# Patient Record
Sex: Male | Born: 1990 | Race: White | Hispanic: No | Marital: Married | Smoking: Never smoker
Health system: Southern US, Community
[De-identification: ages and names within clinical notes are randomized; demographics above are authoritative.]

## PROBLEM LIST (undated history)

## (undated) DIAGNOSIS — F411 Generalized anxiety disorder: Secondary | ICD-10-CM

## (undated) HISTORY — PX: NO PAST SURGERIES: SHX2092

## (undated) HISTORY — DX: Generalized anxiety disorder: F41.1

---
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
## 2009-04-13 ENCOUNTER — Ambulatory Visit (HOSPITAL_COMMUNITY): Payer: Self-pay | Admitting: Psychiatry

## 2017-06-19 ENCOUNTER — Ambulatory Visit (INDEPENDENT_AMBULATORY_CARE_PROVIDER_SITE_OTHER): Payer: 59 | Admitting: Osteopathic Medicine

## 2017-06-19 ENCOUNTER — Encounter: Payer: Self-pay | Admitting: Osteopathic Medicine

## 2017-06-19 VITALS — BP 131/62 | HR 61 | Temp 98.2°F | Ht 73.0 in | Wt 197.1 lb

## 2017-06-19 DIAGNOSIS — Z008 Encounter for other general examination: Secondary | ICD-10-CM

## 2017-06-19 DIAGNOSIS — F411 Generalized anxiety disorder: Secondary | ICD-10-CM | POA: Diagnosis not present

## 2017-06-19 DIAGNOSIS — Z0189 Encounter for other specified special examinations: Secondary | ICD-10-CM

## 2017-06-19 HISTORY — DX: Generalized anxiety disorder: F41.1

## 2017-06-19 MED ORDER — CLONAZEPAM 0.5 MG PO TABS: 0.2500 mg | ORAL_TABLET | Freq: Two times a day (BID) | ORAL | 0 refills | Status: DC | PRN

## 2017-06-19 MED ORDER — SERTRALINE HCL 25 MG PO TABS: 25.0000 mg | ORAL_TABLET | Freq: Every day | ORAL | 1 refills | Status: DC

## 2017-06-19 NOTE — Progress Notes (Signed)
HPI: Jesse Hoover is a 27 y.o. male who  has no past medical history on file.  he presents to [facility] today, 06/19/17,  for chief complaint of: Establish Care - new patient Biometrics form needing filled out but he doesn't have the form Anxiety     Past few years, has been having some anxiety issues - urgent care started him on Klonopin and Zoloft, when he asked them for refills he was told he needed a PCP. This was a few years ago. He states he took the Klonopin at 0.25 mg for about 2 weeks, was on the Zoloft for maybe 2 months. Noticed that it was helping, cannot recall any adverse side effects.  "Generally happy, everything to be grateful for" so not sure why he's having anxiety. Chest pain, feeling boxed in. First episode at work 3 year ago.   t this point, he states general constant level attention is starting to affect his quality of life and his work/relationships. Overall, he has a very supportive family, good job. He denies any issues with depression.  GAD 7 : Generalized Anxiety Score 06/19/2017  Nervous, Anxious, on Edge 2  Control/stop worrying 1  Worry too much - different things 1  Trouble relaxing 1  Restless 0  Easily annoyed or irritable 1  Afraid - awful might happen 1  Total GAD 7 Score 7    Depression screen PHQ 2/9 06/19/2017  Decreased Interest 0  Down, Depressed, Hopeless 0  PHQ - 2 Score 0  Altered sleeping 0  Tired, decreased energy 0  Change in appetite 1  Feeling bad or failure about yourself  0  Trouble concentrating 2  Moving slowly or fidgety/restless 0  Suicidal thoughts 0  PHQ-9 Score 3  Difficult doing work/chores Somewhat difficult   Mood disorder questionnaire negative Reports history of attention deficit for which she was on stimulants as a child, "grew out of this"and has not been on medications are felt he needed any for many years      Past medical, surgical, social and family history  reviewed:  There are no active problems to display for this patient.  History reviewed. No pertinent surgical history.  Social History   Tobacco Use  . Smoking status: Never Smoker  . Smokeless tobacco: Never Used  Substance Use Topics  . Alcohol use: Yes    Comment: 4-5 drinks once a mth    Family History  Problem Relation Age of Onset  . Alcoholism Maternal Grandmother   . Leukemia Maternal Grandmother   . Esophageal cancer Maternal Grandfather   . Lung cancer Maternal Grandfather      Current medication list and allergy/intolerance information reviewed:    No current outpatient medications on file.   No current facility-administered medications for this visit.     Allergies  Allergen Reactions  . Bee Pollen Swelling      Review of Systems:  Constitutional:  No  fever, no chills, No recent illness, No unintentional weight changes. No significant fatigue.   HEENT: No  headache, no vision change  Cardiac: No  chest pain, No  Pressure unless having an anxiety attack, No palpitations  Respiratory:  No  shortness of breath. No  Cough  Gastrointestinal: No  abdominal pain, No  nausea, No  vomiting,  No  blood in stool, No  diarrhea, No  constipation   Musculoskeletal: No new myalgia/arthralgia  Skin: No  Rash, No other wounds/concerning lesions  Hem/Onc: No  easy bruising/bleeding  Endocrine: No cold intolerance,  No heat intolerance.  Neurologic: No  weakness, No  dizziness,  Psychiatric: No  concerns with depression, +concerns with anxiety, No sleep problems, No mood problems  Exam:  BP 131/62   Pulse 61   Temp 98.2 F (36.8 C) (Oral)   Ht 6\' 1"  (1.854 m)   Wt 197 lb 1.9 oz (89.4 kg)   BMI 26.01 kg/m   Constitutional: VS see above. General Appearance: alert, well-developed, well-nourished, NAD  Eyes: Normal lids and conjunctive, non-icteric sclera  Ears, Nose, Mouth, Throat: MMM, Normal external inspection ears/nares/mouth/lips/gums.  Neck:  No masses, trachea midline. No thyroid enlargement. No tenderness/mass appreciated. No lymphadenopathy  Respiratory: Normal respiratory effort. no wheeze, no rhonchi, no rales  Cardiovascular: S1/S2 normal, no murmur, no rub/gallop auscultated. RRR.   Musculoskeletal: Gait normal.   Neurological: Normal balance/coordination. No tremor.   Skin: warm, dry, intact. No rash/ulcer  Psychiatric: Normal judgment/insight. Normal mood and affect. Oriented x3.      ASSESSMENT/PLAN:   Generalized anxiety disorder - Plan: sertraline (ZOLOFT) 25 MG tablet, clonazePAM (KLONOPIN) 0.5 MG tablet, CBC, COMPLETE METABOLIC PANEL WITH GFR, Lipid panel, TSH  Encounter for biometric screening - Plan: COMPLETE METABOLIC PANEL WITH GFR, Lipid panel    Meds ordered this encounter  Medications  . sertraline (ZOLOFT) 25 MG tablet    Sig: Take 1 tablet (25 mg total) by mouth daily.    Dispense:  30 tablet    Refill:  1  . clonazePAM (KLONOPIN) 0.5 MG tablet    Sig: Take 0.5-1 tablets (0.25-0.5 mg total) by mouth 2 (two) times daily as needed for anxiety.    Dispense:  15 tablet    Refill:  0     Patient Instructions  We are starting a medication today called sertraline aka Zoloft to help treat your anxiety. This is a daily medication to help control your symptoms and prevent panic attacks.   I also highly encourage my patients who are suffering from anxiety to seek care with a counselor or therapist. A therapist can coach you in techniques to recognize and deal with troubling thought patterns and behaviors or symptoms. The ability to cope with external stressors is crucial to overall mental health.   Expect a call or message form this office in the next 2 weeks to check in: If you're doing well on the medicine but not feeling any effect, we can increase the dose. If you're starting to feel some effect/improvement, we can hold off on a dose increase and reevaluate at your office visit.   Try the  as-needed medicine, clonazepam aka Klonopin, up to two times per day, try half dose first as this drug can cause sleepiness. Please reserve this medicine for severe symptoms. I do not prescribe this for daily use or to help with sleep, as it can cause addiction when used like this improperly.   Let's plan to follow up in the office in 4 weeks. At that time, we can talk about how well the medicine is working for you, and we can consider increasing the dose, adding another medicine, etc.   If we are having trouble finding a good medication regimen for you, we can consult with a psychiatrist to assist with medication management.   If you experience problematic side effects, please let me know ASAP - we can switch the medicine any time, and we don't need an appointment for this.   Any questions or concerns, call me!  Visit summary with medication list and pertinent instructions was printed for patient to review. All questions at time of visit were answered - patient instructed to contact office with any additional concerns. ER/RTC precautions were reviewed with the patient.   Follow-up plan: Return in about 4 weeks (around 07/17/2017) for recheck anxiety, sooner if needed.    Please note: voice recognition software was used to produce this document, and typos may escape review. Please contact Dr. Lyn Hollingshead for any needed clarifications.

## 2017-06-19 NOTE — Patient Instructions (Addendum)
We are starting a medication today called sertraline aka Zoloft to help treat your anxiety. This is a daily medication to help control your symptoms and prevent panic attacks.   I also highly encourage my patients who are suffering from anxiety to seek care with a counselor or therapist. A therapist can coach you in techniques to recognize and deal with troubling thought patterns and behaviors or symptoms. The ability to cope with external stressors is crucial to overall mental health.   Expect a call or message form this office in the next 2 weeks to check in: If you're doing well on the medicine but not feeling any effect, we can increase the dose. If you're starting to feel some effect/improvement, we can hold off on a dose increase and reevaluate at your office visit.   Try the as-needed medicine, clonazepam aka Klonopin, up to two times per day, try half dose first as this drug can cause sleepiness. Please reserve this medicine for severe symptoms. I do not prescribe this for daily use or to help with sleep, as it can cause addiction when used like this improperly.   Let's plan to follow up in the office in 4 weeks. At that time, we can talk about how well the medicine is working for you, and we can consider increasing the dose, adding another medicine, etc.   If we are having trouble finding a good medication regimen for you, we can consult with a psychiatrist to assist with medication management.   If you experience problematic side effects, please let me know ASAP - we can switch the medicine any time, and we don't need an appointment for this.   Any questions or concerns, call me!

## 2017-06-20 LAB — COMPLETE METABOLIC PANEL WITH GFR
AG Ratio: 2.2 (calc) (ref 1.0–2.5)
ALBUMIN MSPROF: 4.7 g/dL (ref 3.6–5.1)
ALKALINE PHOSPHATASE (APISO): 54 U/L (ref 40–115)
ALT: 38 U/L (ref 9–46)
AST: 27 U/L (ref 10–40)
BILIRUBIN TOTAL: 0.7 mg/dL (ref 0.2–1.2)
BUN: 12 mg/dL (ref 7–25)
CHLORIDE: 105 mmol/L (ref 98–110)
CO2: 26 mmol/L (ref 20–32)
CREATININE: 1.04 mg/dL (ref 0.60–1.35)
Calcium: 9.4 mg/dL (ref 8.6–10.3)
GFR, Est African American: 114 mL/min/{1.73_m2} (ref >=60)
GFR, Est Non African American: 98 mL/min/{1.73_m2} (ref >=60)
GLUCOSE: 93 mg/dL (ref 65–99)
Globulin: 2.1 g/dL (calc) (ref 1.9–3.7)
Potassium: 4.3 mmol/L (ref 3.5–5.3)
SODIUM: 139 mmol/L (ref 135–146)
Total Protein: 6.8 g/dL (ref 6.1–8.1)

## 2017-06-20 LAB — LIPID PANEL
CHOL/HDL RATIO: 2.7 (calc) (ref <5.0)
Cholesterol: 116 mg/dL (ref <200)
HDL: 43 mg/dL (ref >40)
LDL CHOLESTEROL (CALC): 56 mg/dL
Non-HDL Cholesterol (Calc): 73 mg/dL (calc) (ref <130)
Triglycerides: 88 mg/dL (ref <150)

## 2017-06-20 LAB — CBC
HEMATOCRIT: 43.6 % (ref 38.5–50.0)
HEMOGLOBIN: 15.5 g/dL (ref 13.2–17.1)
MCH: 30.9 pg (ref 27.0–33.0)
MCHC: 35.6 g/dL (ref 32.0–36.0)
MCV: 86.9 fL (ref 80.0–100.0)
MPV: 10.4 fL (ref 7.5–12.5)
Platelets: 203 10*3/uL (ref 140–400)
RBC: 5.02 10*6/uL (ref 4.20–5.80)
RDW: 12 % (ref 11.0–15.0)
WBC: 4 10*3/uL (ref 3.8–10.8)

## 2017-06-20 LAB — TSH: TSH: 1.59 mIU/L (ref 0.40–4.50)

## 2017-07-04 ENCOUNTER — Telehealth: Payer: Self-pay | Admitting: Osteopathic Medicine

## 2017-07-04 DIAGNOSIS — F411 Generalized anxiety disorder: Secondary | ICD-10-CM

## 2017-07-04 MED ORDER — CLONAZEPAM 0.5 MG PO TABS
0.2500 mg | ORAL_TABLET | Freq: Two times a day (BID) | ORAL | 0 refills | Status: DC | PRN
Start: 2017-07-04 — End: 2017-07-17

## 2017-07-04 NOTE — Telephone Encounter (Signed)
OK. He really shouldn't be taking this on a daily basis as this can cause dependence. Since he's been on the Zoloft for a bit now, we should reserve the Klonopin for severe anxiety or panic issues, 1-2 times per week max. Will refill this time but will discuss in detail at follow-up.

## 2017-07-04 NOTE — Telephone Encounter (Signed)
-----   Message from Sunnie NielsenNatalie Kandas Oliveto, DO sent at 06/19/2017 10:00 AM EDT ----- Regarding: anxiety  Started zoloft and klonopin

## 2017-07-04 NOTE — Telephone Encounter (Signed)
Lease call patient: Just checking seeing how he is doing after starting Zoloft and Klonopin at last visit. Any problems or questions, let me know! Otherwise can follow up as discussed at visit

## 2017-07-04 NOTE — Telephone Encounter (Signed)
Pt advised, no further questions.

## 2017-07-04 NOTE — Telephone Encounter (Signed)
Spoke with Pt, he feels the new Rx's are helping. He works 10 hr days at work, so he was taking the clonazePAM (KLONOPIN) 0.5 MG tablet one whole tablet daily. He reports trying 0.5 tab, but it wore off before the workday was over. He reports only needing one whole tab daily. He thinks the zoloft is helping also.   Because he has been taking one whole tab of the klonopin daily, he is out. Will route.

## 2017-07-17 ENCOUNTER — Encounter: Payer: Self-pay | Admitting: Osteopathic Medicine

## 2017-07-17 ENCOUNTER — Ambulatory Visit (INDEPENDENT_AMBULATORY_CARE_PROVIDER_SITE_OTHER): Payer: 59 | Admitting: Osteopathic Medicine

## 2017-07-17 VITALS — BP 121/52 | HR 56 | Temp 98.7°F | Wt 194.0 lb

## 2017-07-17 DIAGNOSIS — Z0000 Encounter for general adult medical examination without abnormal findings: Secondary | ICD-10-CM

## 2017-07-17 DIAGNOSIS — Z0001 Encounter for general adult medical examination with abnormal findings: Secondary | ICD-10-CM | POA: Diagnosis not present

## 2017-07-17 DIAGNOSIS — F411 Generalized anxiety disorder: Secondary | ICD-10-CM

## 2017-07-17 DIAGNOSIS — Z23 Encounter for immunization: Secondary | ICD-10-CM | POA: Diagnosis not present

## 2017-07-17 MED ORDER — CLONAZEPAM 0.5 MG PO TABS: ORAL_TABLET | ORAL | 1 refills | Status: DC

## 2017-07-17 MED ORDER — SERTRALINE HCL 25 MG PO TABS: 25.0000 mg | ORAL_TABLET | Freq: Every day | ORAL | 1 refills | Status: DC

## 2017-07-17 NOTE — Patient Instructions (Addendum)
Plan:  Labs annually - everything looks good now!  Anxiety: For continuation of controlled substances, I ask patients to come see me regularly, about every 6 months for continuation of refills. Please see me sooner if problems with medications, symptoms, etc!    Please note: Preventive care issues were addressed today as part of your annual wellness physical, and this care should be covered under your insurance. However there were other medical issues which were also addressed today with discussion of anxiety/medications, and insurance may bill you separately for a "problem-based visit" for this care. Any questions or concerns about charges which may appear on your billing statement should be directed to your insurance company or to [facility] billing department, and they will contact our office if there are further concerns.

## 2017-07-17 NOTE — Progress Notes (Signed)
HPI: Jesse Hoover is a 27 y.o. male who  has no past medical history on file.  he presents to [facility] today, 07/17/17,  for chief complaint of: Annual Physical/ Preventive Care   Patient here for annual physical / wellness exam.  See preventive care reviewed as below.  Recent labs reviewed in detail with the patient.   Additional concerns today include:  Anxiety medications started last visit, clonazepam seems to really help when he needs it, hasn't noticed a tender difference on the Zoloft. He reports tiredness. 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    Past medical, surgical, social and family history reviewed:  Patient Active Problem List   Diagnosis Date Noted  . Generalized anxiety disorder 06/19/2017   No past surgical history on file.  Social History   Tobacco Use  . Smoking status: Never Smoker  . Smokeless tobacco: Never Used  Substance Use Topics  . Alcohol use: Yes    Comment: 4-5 drinks once a mth    Family History  Problem Relation Age of Onset  . Alcoholism Maternal Grandmother   . Leukemia Maternal Grandmother   . Esophageal cancer Maternal Grandfather   . Lung cancer Maternal Grandfather      Current medication list and allergy/intolerance information reviewed:    Current Outpatient Medications  Medication Sig Dispense Refill  . clonazePAM (KLONOPIN) 0.5 MG tablet Take 0.5-1 tablets (0.25-0.5 mg total) by mouth 2 (two) times daily as needed for anxiety. Sparing use to prevent tolerance/dependence 15 tablet 0  . sertraline (ZOLOFT) 25 MG tablet Take 1 tablet (25 mg total) by mouth daily. 30 tablet 1   No current facility-administered medications for this visit.     Allergies  Allergen Reactions  . Bee Pollen Swelling      Review of Systems:  Constitutional:  No  fever, no chills, No recent illness, No unintentional weight changes. No significant fatigue.    HEENT: No  headache, no vision change  Cardiac: No  chest pain  Respiratory:  No  shortness of breath. No  Cough  Gastrointestinal: No  abdominal pain, No  nausea  Musculoskeletal: No new myalgia/arthralgia  Skin: No  Rash  Neurologic: No  weakness, No  dizziness  Psychiatric: No  concerns with depression, No  concerns with anxiety, No sleep problems, No mood problems  Exam:  BP (!) 121/52 (BP Location: Left Arm, Patient Position: Sitting, Cuff Size: Large)   Pulse (!) 56   Temp 98.7 F (37.1 C) (Oral)   Wt 194 lb 0.6 oz (88 kg)   BMI 25.60 kg/m   Constitutional: VS see above. General Appearance: alert, well-developed, well-nourished, NAD  Eyes: Normal lids and conjunctive, non-icteric sclera  Ears, Nose, Mouth, Throat: MMM, Normal external inspection ears/nares/mouth/lips/gums. TM normal bilaterally. Pharynx/tonsils no erythema, no exudate. Nasal mucosa normal.   Neck: No masses, trachea midline. No thyroid enlargement. No tenderness/mass appreciated. No lymphadenopathy  Respiratory: Normal respiratory effort. no wheeze, no rhonchi, no rales  Cardiovascular: S1/S2 normal, no murmur, no rub/gallop auscultated. RRR. No lower extremity edema.   Gastrointestinal: Nontender, no masses. No hepatomegaly, no splenomegaly. No hernia appreciated. Bowel sounds normal. Rectal exam deferred.   Musculoskeletal: Gait normal. No clubbing/cyanosis of digits.   Neurological: Normal balance/coordination. No tremor. No cranial nerve deficit on limited exam. Motor and sensation intact and symmetric. Cerebellar reflexes intact.   Skin: warm, dry, intact. No rash/ulcer. No concerning nevi or  subq nodules on limited exam.    Psychiatric: Normal judgment/insight. Normal mood and affect. Oriented x3.      ASSESSMENT/PLAN:   Annual physical exam  Need for Tdap vaccination - Plan: Tdap vaccine greater than or equal to 7yo IM  Generalized anxiety disorder - advised he can consider  alternative to sertraline, he would like to stay as is for now. Refills given, see patient instructions. - Plan: sertraline (ZOLOFT) 25 MG tablet, clonazePAM (KLONOPIN) 0.5 MG tablet     MALE PREVENTIVE CARE  updated 07/17/17  ANNUAL SCREENING/COUNSELING  Any changes to health in the past year? no  Diet/Exercise - HEALTHY HABITS DISCUSSED TO DECREASE CV RISK Social History   Tobacco Use  Smoking Status Never Smoker  Smokeless Tobacco Never Used   Social History   Substance and Sexual Activity  Alcohol Use Yes   Comment: 4-5 drinks once a mth   Depression screen [facility] 2/9 06/19/2017  Decreased Interest 0  Down, Depressed, Hopeless 0  PHQ - 2 Score 0  Altered sleeping 0  Tired, decreased energy 0  Change in appetite 1  Feeling bad or failure about yourself  0  Trouble concentrating 2  Moving slowly or fidgety/restless 0  Suicidal thoughts 0  PHQ-9 Score 3  Difficult doing work/chores Somewhat difficult    SEXUAL/REPRODUCTIVE HEALTH  Sexually active in the past year? - Yes with male.  STI testing needed/desired today? - no  Any concerns with testosterone/libido? - no  INFECTIOUS DISEASE SCREENING  HIV - needs, declined   GC/CT - does not need  HepC - does not need  TB - does not need  CANCER SCREENING  Lung - does not need  Colon - does not need  Prostate - does not need  OTHER DISEASE SCREENING  Lipid - does not need  DM2 - does not need  AAA - 65-75yo ever smoked: does not need  Osteoporosis - men 28yo+ - does not need  ADULT VACCINATION  Influenza - annual vaccine recommended  Td - booster every 10 years   Zoster - Shingrix recommended 80+ years old  PCV13 - was not indicated  PPSV23 - was not indicated Immunization History  Administered Date(s) Administered  . Influenza-Unspecified 02/08/2017  . Tdap 07/17/2017     Patient Instructions  Plan:  Labs annually - everything looks good now!  Anxiety: For continuation of  controlled substances, I ask patients to come see me regularly, about every 6 months for continuation of refills. Please see me sooner if problems with medications, symptoms, etc!    Please note: Preventive care issues were addressed today as part of your annual wellness physical, and this care should be covered under your insurance. However there were other medical issues which were also addressed today with discussion of anxiety/medications, and insurance may bill you separately for a "problem-based visit" for this care. Any questions or concerns about charges which may appear on your billing statement should be directed to your insurance company or to [facility] billing department, and they will contact our office if there are further concerns.       Visit summary with medication list and pertinent instructions was printed for patient to review. All questions at time of visit were answered - patient instructed to contact office with any additional concerns. ER/RTC precautions were reviewed with the patient.   Follow-up plan: Return in about 6 months (around 01/16/2018) for recheck anxiety, sooner if needed. Annual check-up and labs in one year .  Note: Total time spent on problem-baed portion of visit for anxiety follow up was 15 minutes, greater than 50% of the time was spent face-to-face counseling and coordinating care   Please note: voice recognition software was used to produce this document, and typos may escape review. Please contact Dr. Lyn HollingsheadAlexander for any needed clarifications.

## 2017-07-30 ENCOUNTER — Telehealth: Payer: Self-pay

## 2017-07-30 NOTE — Telephone Encounter (Signed)
He and I discussed sparing use of this medicine at his last visit to avoid dependence/addiction.   The pharmacy should have printed it on the Rx bottle per my instructions: "Take 0.5-1 tablets po bid prn severe anxiety. Limit use to prevent dependence. #30 x ninety days" was my instruction on last Rx which was sent 07/17/17. He should not be taking the medicine on a daily basis - if he is having panic symptoms that frequently we need to address his maintenance medicines or discuss a referral to psychiatry. He needs appointment with me to discuss increasing dose Zoloft or changing this medicine, or I can send referral to behavioral health.

## 2017-07-30 NOTE — Telephone Encounter (Signed)
Pt called requesting a refill for clonazepam. As per pt, he tried to place a refill request for the med, but was informed by pharmacist that he will not be able to get med until 07/13/17. Pt requesting a call back from provider to discuss early med refill. Thanks.

## 2017-07-31 NOTE — Telephone Encounter (Signed)
Left a detailed vm msg for pt regarding med refill & provider's note. Call back information provided.

## 2017-08-11 ENCOUNTER — Other Ambulatory Visit: Payer: Self-pay | Admitting: Osteopathic Medicine

## 2017-08-11 DIAGNOSIS — F411 Generalized anxiety disorder: Secondary | ICD-10-CM

## 2017-09-06 ENCOUNTER — Other Ambulatory Visit: Payer: Self-pay | Admitting: Osteopathic Medicine

## 2017-09-06 ENCOUNTER — Telehealth: Payer: Self-pay

## 2017-09-06 DIAGNOSIS — F411 Generalized anxiety disorder: Secondary | ICD-10-CM

## 2017-09-06 NOTE — Telephone Encounter (Signed)
Pt left msg concerned because he has been out of his Clonazepam for about 2-3 days and feels that he needs it.   Script for pt Clonazepam 0.5mg , 0.5-1 tab po BID PRN anxiety for #30 was sent in for a 90 day supply.  Pt states this is not enough. He states he used to receive #15 per month instead of #10. He is working more and more anxious.  Pt wanting to know if this can be increased

## 2017-09-07 NOTE — Telephone Encounter (Signed)
Jesse Hoover, see other note on this. I'm not going to authorize refill now, we need to chat w/ patient and make sure he understand the instructions for sparing use of this medicine.Marland KitchenMarland KitchenMarland Kitchen

## 2017-09-07 NOTE — Telephone Encounter (Signed)
Patient has been advised that clonazepam is not for daily use or for long-term control of anxiety, rather for severe symptoms as needed, limited to a maximum of few times per week to avoid dependence - 30 for 90 days should be sufficient. I am concerned he is already dependent on this medicine. Ideally, the Zoloft/sertraline should be working now such that he doesn't feel a need for clonazepam as frequently but sounds like he's been taking this daily if he is out of medicine.   I can refill but it now for 30 tablets again for 90 days but there should be no more confusion on the need to avoid daily use of these drugs and there will be no more early refills.  If anxiety isn't well controlled on zoloft, or if he has stopped taking this, he needs to make an appointment to discuss alternative medicines to help his symptoms more effectively without need for controlled substances.  If he is not amenable to this plan I am happy to refer him to a psychiatrist for second opinion.

## 2017-09-10 NOTE — Telephone Encounter (Signed)
Left detailed msg for pt on cell phone concerning Dr Amanda PeaAlexanders message. I have asked pt to call office back and let us know that he understand 30 tabs must last 90 days (no more early refills) and for him to let us know if he is taking Zoloft/is it working/does he want a second opinion from psychiatrist.

## 2017-09-10 NOTE — Telephone Encounter (Signed)
I have called and left pt a detailed msg about this this AM.

## 2017-09-11 NOTE — Telephone Encounter (Signed)
I called pt to make sure that he understood and he told me that he needed me to call back im assuming he was at work. Will retry at a later time. W.Maryanne Huneycutt, CCMA

## 2018-01-16 ENCOUNTER — Ambulatory Visit: Payer: 59 | Admitting: Osteopathic Medicine

## 2018-01-24 ENCOUNTER — Encounter: Payer: Self-pay | Admitting: Osteopathic Medicine

## 2018-01-24 ENCOUNTER — Ambulatory Visit (INDEPENDENT_AMBULATORY_CARE_PROVIDER_SITE_OTHER): Payer: 59 | Admitting: Osteopathic Medicine

## 2018-01-24 VITALS — BP 137/83 | HR 60 | Temp 97.8°F | Wt 206.9 lb

## 2018-01-24 DIAGNOSIS — F411 Generalized anxiety disorder: Secondary | ICD-10-CM

## 2018-01-24 DIAGNOSIS — Z23 Encounter for immunization: Secondary | ICD-10-CM | POA: Diagnosis not present

## 2018-01-24 MED ORDER — CLONAZEPAM 0.5 MG PO TABS: ORAL_TABLET | ORAL | 1 refills | Status: DC

## 2018-01-24 MED ORDER — SERTRALINE HCL 25 MG PO TABS: 25.0000 mg | ORAL_TABLET | Freq: Every day | ORAL | 3 refills | Status: DC

## 2018-01-24 NOTE — Progress Notes (Signed)
HPI: Jesse Hoover is a 27 y.o. male who  has no past medical history on file.  he presents to [facility] today, 01/24/18,  for chief complaint of:  Follow-up anxiety, maintenance of prescriptions  States the Zoloft has been working pretty well for him overall, he is taking half a tablet daily most of the time.  He is requiring the Klonopin weekly on work days, is struggling when he runs out of this medication.  Work is regularly stressful for him.  He does not take the medication on weekends and he does not find that he misses it.      Past medical history, surgical history, and family history reviewed.  Current medication list and allergy/intolerance information reviewed.   (See remainder of HPI, ROS, Phys Exam below)    ASSESSMENT/PLAN:   Generalized anxiety disorder - Plan: sertraline (ZOLOFT) 25 MG tablet, clonazePAM (KLONOPIN) 0.5 MG tablet, DISCONTINUED: sertraline (ZOLOFT) 25 MG tablet, DISCONTINUED: clonazePAM (KLONOPIN) 0.5 MG tablet  Need for influenza vaccination - Plan: Flu Vaccine QUAD 6+ mos PF IM (Fluarix Quad PF)   Meds ordered this encounter  Medications  . DISCONTD: sertraline (ZOLOFT) 25 MG tablet    Sig: Take 1 tablet (25 mg total) by mouth daily.    Dispense:  90 tablet    Refill:  3  . DISCONTD: clonazePAM (KLONOPIN) 0.5 MG tablet    Sig: Take 0.5-1 tablets po bid prn severe anxiety on work days. Limit use to prevent dependence. #60 for ninety days    Dispense:  60 tablet    Refill:  1  . sertraline (ZOLOFT) 25 MG tablet    Sig: Take 1 tablet (25 mg total) by mouth daily.    Dispense:  90 tablet    Refill:  3  . clonazePAM (KLONOPIN) 0.5 MG tablet    Sig: Take 0.5-1 tablets po bid prn severe anxiety on work days. Limit use to prevent dependence. #60 for ninety days    Dispense:  60 tablet    Refill:  1     Follow-up plan: Return in about 6 months (around 07/26/2018) for annual physical - sooner if  needed.                            ############################################ ############################################ ############################################ ############################################    Outpatient Encounter Medications as of 01/24/2018  Medication Sig  . clonazePAM (KLONOPIN) 0.5 MG tablet Take 0.5-1 tablets po bid prn severe anxiety. Limit use to prevent dependence. #30 x ninety days  . sertraline (ZOLOFT) 25 MG tablet Take 1 tablet (25 mg total) by mouth daily.  . sertraline (ZOLOFT) 25 MG tablet TAKE 1 TABLET BY MOUTH EVERY DAY   No facility-administered encounter medications on file as of 01/24/2018.    Allergies  Allergen Reactions  . Bee Pollen Swelling      Review of Systems:  Constitutional: No recent illness  Cardiac: No  chest pain, No  pressure, No palpitations  Respiratory:  No  shortness of breath. No  Cough  Gastrointestinal: No  abdominal pain, no change on bowel habits  Musculoskeletal: No new myalgia/arthralgia  Neurologic: No  weakness, No  Dizziness  Psychiatric: No  concerns with depression, +concerns with anxiety  Exam:  BP 137/83 (BP Location: Left Arm, Patient Position: Sitting, Cuff Size: Normal)   Pulse 60   Temp 97.8 F (36.6 C) (Oral)   Wt 206 lb 14.4 oz (93.8 kg)  BMI 27.30 kg/m   Constitutional: VS see above. General Appearance: alert, well-developed, well-nourished, NAD  Eyes: Normal lids and conjunctive, non-icteric sclera  Ears, Nose, Mouth, Throat: MMM, Normal external inspection ears/nares/mouth/lips/gums.  Neck: No masses, trachea midline.   Respiratory: Normal respiratory effort.   Musculoskeletal: Gait normal. Symmetric and independent movement of all extremities  Neurological: Normal balance/coordination. No tremor.  Skin: warm, dry, intact.   Psychiatric: Normal judgment/insight. Normal mood and affect. Oriented x3.   Visit summary with medication list  and pertinent instructions was printed for patient to review, advised to alert [place] if any changes needed. All questions at time of visit were answered - patient instructed to contact office with any additional concerns. ER/RTC precautions were reviewed with the patient and understanding verbalized.   Follow-up plan: Return in about 6 months (around 07/26/2018) for annual physical - sooner if needed.    Please note: voice recognition software was used to produce this document, and typos may escape review. Please contact Dr. Lyn Hollingshead for any needed clarifications.

## 2018-05-13 ENCOUNTER — Encounter: Payer: Self-pay | Admitting: Family Medicine

## 2018-05-13 ENCOUNTER — Ambulatory Visit: Payer: 59 | Admitting: Osteopathic Medicine

## 2018-05-13 ENCOUNTER — Ambulatory Visit (INDEPENDENT_AMBULATORY_CARE_PROVIDER_SITE_OTHER): Payer: 59 | Admitting: Family Medicine

## 2018-05-13 ENCOUNTER — Ambulatory Visit (INDEPENDENT_AMBULATORY_CARE_PROVIDER_SITE_OTHER): Payer: 59

## 2018-05-13 VITALS — BP 119/62 | HR 75

## 2018-05-13 DIAGNOSIS — M25561 Pain in right knee: Secondary | ICD-10-CM

## 2018-05-13 NOTE — Patient Instructions (Addendum)
Thank you for coming in today. Recheck in about 1 week.  Ok to start walking if able.  Use the hinged knee brace.  Ibuprofen or tylenol for pain.     Meniscus Tear  A meniscus tear is a knee injury that happens when a piece of the meniscus is torn. The meniscus is a thick, rubbery, wedge-shaped cartilage in the knee. Two menisci are located in each knee. They sit between the upper bone (femur) and lower bone (tibia) that make up the knee joint. Each meniscus acts as a shock absorber for the knee. A torn meniscus is one of the most common types of knee injuries. This injury can range from mild to severe. Surgery may be needed to repair a severe tear. What are the causes? This condition may be caused by any kneeling, squatting, twisting, or pivoting movement. Sports-related injuries are the most common cause. These often occur from:  Running and stopping suddenly. ? Changing direction. ? Being tackled or knocked off your feet.  Lifting or carrying heavy weights. As people get older, their menisci get thinner and weaker. In these people, tears can happen more easily, such as from climbing stairs. What increases the risk? You are more likely to develop this condition if you:  Play contact sports.  Have a job that requires kneeling or squatting.  Are male.  Are over 29 years old. What are the signs or symptoms? Symptoms of this condition include:  Knee pain, especially at the side of the knee joint. You may feel pain when the injury occurs, or you may only hear a pop and feel pain later.  A feeling that your knee is clicking, catching, locking, or giving way.  Not being able to fully bend or extend your knee.  Bruising or swelling in your knee. How is this diagnosed? This condition may be diagnosed based on your symptoms and a physical exam. You may also have tests, such as:  X-rays.  MRI.  A procedure to look inside your knee with a narrow surgical telescope  (arthroscopy). You may be referred to a knee specialist (orthopedic surgeon). How is this treated? Treatment for this injury depends on the severity of the tear. Treatment for a mild tear may include:  Rest.  Medicine to reduce pain and swelling. This is usually a nonsteroidal anti-inflammatory drug (NSAID), like ibuprofen.  A knee brace, sleeve, or wrap.  Using crutches or a walker to keep weight off your knee and to help you walk.  Exercises to strengthen your knee (physical therapy). You may need surgery if you have a severe tear or if other treatments are not working. Follow these instructions at home: If you have a brace, sleeve, or wrap:  Wear it as told by your health care provider. Remove it only as told by your health care provider.  Loosen the brace, sleeve, or wrap if your toes tingle, become numb, or turn cold and blue.  Keep the brace, sleeve, or wrap clean and dry.  If the brace, sleeve, or wrap is not waterproof: ? Do not let it get wet. ? Cover it with a watertight covering when you take a bath or shower. Managing pain and swelling   Take over-the-counter and prescription medicines only as told by your health care provider.  If directed, put ice on your knee: ? If you have a removable brace, sleeve, or wrap, remove it as told by your health care provider. ? Put ice in a plastic bag. ? Place  a towel between your skin and the bag. ? Leave the ice on for 20 minutes, 2-3 times per day.  Move your toes often to avoid stiffness and to lessen swelling.  Raise (elevate) the injured area above the level of your heart while you are sitting or lying down. Activity  Do not use the injured limb to support your body weight until your health care provider says that you can. Use crutches or a walker as told by your health care provider.  Return to your normal activities as told by your health care provider. Ask your health care provider what activities are safe for  you.  Perform range-of-motion exercises only as told by your health care provider.  Begin doing exercises to strengthen your knee and leg muscles only as told by your health care provider. After you recover, your health care provider may recommend these exercises to help prevent another injury. General instructions  Use a knee brace, sleeve, or wrap as told by your health care provider.  Ask your health care provider when it is safe to drive if you have a brace, sleeve, or wrap on your knee.  Do not use any products that contain nicotine or tobacco, such as cigarettes, e-cigarettes, and chewing tobacco. If you need help quitting, ask your health care provider.  Ask your health care provider if the medicine prescribed to you: ? Requires you to avoid driving or using heavy machinery. ? Can cause constipation. You may need to take these actions to prevent or treat constipation:  Drink enough fluid to keep your urine pale yellow.  Take over-the-counter or prescription medicines.  Eat foods that are high in fiber, such as beans, whole grains, and fresh fruits and vegetables.  Limit foods that are high in fat and processed sugars, such as fried or sweet foods.  Keep all follow-up visits as told by your health care provider. This is important. Contact a health care provider if:  You have a fever.  Your knee becomes red, tender, or swollen.  Your pain medicine is not helping.  Your symptoms get worse or do not improve after 2 weeks of home care. Summary  A meniscus tear is a knee injury that happens when a piece of the meniscus is torn.  Treatment for this injury depends on the severity of the tear. You may need surgery if you have a severe tear or if other treatments are not working.  Rest, ice, and raise (elevate) your injured knee as told by your health care provider. This will help lessen pain and swelling.  Contact a health care provider if you have new symptoms, or your  symptoms get worse or do not improve after 2 weeks of home care.  Keep all follow-up visits as told by your health care provider. This is important. This information is not intended to replace advice given to you by your health care provider. Make sure you discuss any questions you have with your health care provider. Document Released: 06/17/2002 Document Revised: 10/09/2017 Document Reviewed: 10/09/2017 Elsevier Interactive Patient Education  2019 [employer]

## 2018-05-13 NOTE — Progress Notes (Signed)
Jesse Hoover is a 28 y.o. male who presents to [facility] today for right knee injury.  Aldon was in his normal state of health over the weekend.  He did lots of exercise including going to the gym where he did some leg exercises.  He then went to a trampoline park with his child where he felt pretty normal.  After leaving the trampoline park his knee had a little bit of pain.  He then did some skateboarding where he landed awkwardly and had immediate pain in his right knee.  He has been unable to bear weight fully since.  He notes some limitation in range of motion as well.  He denies any radiating pain weakness or numbness distally beyond his knee.  He is tried ibuprofen which have helped a bit.  He has a leftover crutches which she has been using which helps some.  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    ROS:  As above  Exam:  BP 119/62   Pulse 75  Wt Readings from Last 5 Encounters:  01/24/18 206 lb 14.4 oz (93.8 kg)  07/17/17 194 lb 0.6 oz (88 kg)  06/19/17 197 lb 1.9 oz (89.4 kg)   General: Well Developed, well nourished, and in no acute distress.  Neuro/Psych: Alert and oriented x3, extra-ocular muscles intact, able to move all 4 extremities, sensation grossly intact. Skin: Warm and dry, no rashes noted.  Respiratory: Not using accessory muscles, speaking in full sentences, trachea midline.  Cardiovascular: Pulses palpable, no extremity edema. Abdomen: Does not appear distended. MSK: Right knee no large effusion relatively normal-appearing with no deformities. Range of motion 5-100 degrees without crepitations. Tender to palpation medial joint line.  Nontender lateral joint line. Some guarding with exam testing but no significant laxity to anterior posterior drawer testing. No laxity but pain present with MCL stress testing. No laxity or pain with LCL stress testing. Patient unable to perform McMurray's  testing due to guarding and pain. Intact flexion and extension strength.  Left knee normal-appearing nontender normal motion stable ligamentous exam testing normal strength.   Again antalgic gait present distally.    Lab and Radiology Results X-ray images right knee personally independently reviewed.  No acute fractures.  Mild degenerative changes present.  Await formal radiology review    Assessment and Plan: 28 y.o. male with  Right knee pain following skateboarding injury.  Unclear etiology at this time.  Patient has significant guarding and loss of range of motion.  Concerning for ligamentous injury.  Plan for hinged knee brace with weightbearing as tolerated with crutches.  Continue ibuprofen at home as well as ice therapy.  Recheck in 1 week or so.  If not improving neck step would likely be MRI.  Fill out FMLA and short-term disability forms as needed.  Work note provided.    Orders Placed This Encounter  Procedures  . DG Knee Complete 4 Views Right    Please include patellar sunrise, lateral, and weightbearing bilateral AP and bilateral rosenberg views    Standing Status:   Future    Number of Occurrences:   1    Standing Expiration Date:   07/13/2019    Order Specific Question:   Reason for exam:    Answer:   trauma series right knee. injury recently    Comments:   Please include patellar sunrise, lateral, and weightbearing bilateral AP and bilateral rosenberg views  Order Specific Question:   Preferred imaging location?    Answer:   [facility]   No orders of the defined types were placed in this encounter.   Historical information moved to improve visibility of documentation.  No past medical history on file. No past surgical history on file. Social History   Tobacco Use  . Smoking status: Never Smoker  . Smokeless tobacco: Never Used  Substance Use Topics  . Alcohol use: Yes    Comment: 4-5 drinks once a mth   family history includes  Alcoholism in his maternal grandmother; Esophageal cancer in his maternal grandfather; Leukemia in his maternal grandmother; Lung cancer in his maternal grandfather.  Medications: Current Outpatient Medications  Medication Sig Dispense Refill  . clonazePAM (KLONOPIN) 0.5 MG tablet Take 0.5-1 tablets po bid prn severe anxiety on work days. Limit use to prevent dependence. #60 for ninety days 60 tablet 1  . sertraline (ZOLOFT) 25 MG tablet Take 1 tablet (25 mg total) by mouth daily. 90 tablet 3   No current facility-administered medications for this visit.    Allergies  Allergen Reactions  . Bee Pollen Swelling      Discussed warning signs or symptoms. Please see discharge instructions. Patient expresses understanding.

## 2018-05-20 ENCOUNTER — Ambulatory Visit (INDEPENDENT_AMBULATORY_CARE_PROVIDER_SITE_OTHER): Payer: 59 | Admitting: Family Medicine

## 2018-05-20 ENCOUNTER — Encounter: Payer: Self-pay | Admitting: Family Medicine

## 2018-05-20 ENCOUNTER — Telehealth: Payer: Self-pay

## 2018-05-20 VITALS — BP 130/54 | HR 66 | Wt 201.0 lb

## 2018-05-20 DIAGNOSIS — M25561 Pain in right knee: Secondary | ICD-10-CM

## 2018-05-20 MED ORDER — DICLOFENAC SODIUM 1 % TD GEL: 4.0000 g | Freq: Four times a day (QID) | TRANSDERMAL | 11 refills | Status: DC

## 2018-05-20 NOTE — Patient Instructions (Addendum)
Thank you for coming in today. Attempt to return to work on 2/17 Let me know if not doing well.   Use the diclofenac gel for pain and swelling 4x daily.

## 2018-05-20 NOTE — Progress Notes (Signed)
Jesse Hoover is a 28 y.o. male who [facility] today for follow-up right knee pain.  Jesse Hoover was seen on February 3 for right knee pain after an injury occurring the day prior.  X-rays were initially unremarkable.  He was thought to perhaps have a ligament strain without tear or perhaps meniscus injury or bone contusion.  In the interim with a little bit of rest he is done very well.  He notes significantly reduced pain and reduced swelling.  He denies severe locking or catching but does note stiffness.  He is not entirely better however.  He notes continued lack of range of motion and swelling in the knee.  He is concerned about his ability to return to work right now.  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  He thinks he probably be able to return to work in about a week.    ROS:  As above  Exam:  BP (!) 130/54   Pulse 66   Wt 201 lb (91.2 kg)   BMI 26.52 kg/m  Wt Readings from Last 5 Encounters:  05/20/18 201 lb (91.2 kg)  01/24/18 206 lb 14.4 oz (93.8 kg)  07/17/17 194 lb 0.6 oz (88 kg)  06/19/17 197 lb 1.9 oz (89.4 kg)   General: Well Developed, well nourished, and in no acute distress.  Neuro/Psych: Alert and oriented x3, extra-ocular muscles intact, able to move all 4 extremities, sensation grossly intact. Skin: Warm and dry, no rashes noted.  Respiratory: Not using accessory muscles, speaking in full sentences, trachea midline.  Cardiovascular: Pulses palpable, no extremity edema. Abdomen: Does not appear distended. MSK:  Right knee: Mild effusion otherwise normal-appearing with no erythema or deformity. Range of motion 0-110 degrees. Mildly tender to palpation medial joint line. Negative anterior and posterior drawer testing. Negative LCL stress testing. Pain with no laxity with MCL stress testing. Minimal Jesse Hoover's test is mildly painful without popping or clicking palpated.  Negative lateral. Intact  flexion and extension strength.  Left knee normal motion normal strength stable ligamentous exam.      Lab and Radiology Results Limited musculoskeletal ultrasound right knee: Mild effusion intact quadriceps and patellar tendon.  Normal LCL and lateral joint line and lateral meniscus. Hypoechoic fluid tracking with increased Doppler activity seen along the proximal MCL and just superficial to the medial meniscus versus the superficial layer of the medial meniscus.  No large visible meniscus tear present.  No abnormal bony structures. Impression: MCL strain with possible superficial medial meniscus injury    Assessment and Plan: 28 y.o. male with right knee pain following injury.  Significant clinical improvement.  Plan for continued hinged knee brace and a bit of relative rest.  Continue home exercise program.  Trial of diclofenac gel.  Trial of return to work on Monday, February 17.  If not improving or if worsening or if unable to return to work next up would likely be MRI to evaluate medial meniscus.   PDMP not reviewed this encounter. No orders of the defined types were placed in this encounter.  Meds ordered this encounter  Medications  . diclofenac sodium (VOLTAREN) 1 % GEL    Sig: Apply 4 g topically 4 (four) times daily. To affected joint.    Dispense:  100 g    Refill:  11    Historical information moved to improve visibility of documentation.  Past Medical History:  Diagnosis Date  . Generalized anxiety disorder 06/19/2017  No past surgical history on file. Social History   Tobacco Use  . Smoking status: Never Smoker  . Smokeless tobacco: Never Used  Substance Use Topics  . Alcohol use: Yes    Comment: 4-5 drinks once a mth   family history includes Alcoholism in his maternal grandmother; Esophageal cancer in his maternal grandfather; Leukemia in his maternal grandmother; Lung cancer in his maternal grandfather.  Medications: Current Outpatient Medications    Medication Sig Dispense Refill  . clonazePAM (KLONOPIN) 0.5 MG tablet Take 0.5-1 tablets po bid prn severe anxiety on work days. Limit use to prevent dependence. #60 for ninety days 60 tablet 1  . sertraline (ZOLOFT) 25 MG tablet Take 1 tablet (25 mg total) by mouth daily. 90 tablet 3  . diclofenac sodium (VOLTAREN) 1 % GEL Apply 4 g topically 4 (four) times daily. To affected joint. 100 g 11   No current facility-administered medications for this visit.    Allergies  Allergen Reactions  . Bee Pollen Swelling      Discussed warning signs or symptoms. Please see discharge instructions. Patient expresses understanding.

## 2018-05-20 NOTE — Telephone Encounter (Signed)
Patient called stated that his knee is not better. He wanted to know what he should do. I offered the patient to come in today to be evaluated and he stated that he will keep his appointment for in the morning. Auguste Tebbetts,CMA

## 2018-05-21 ENCOUNTER — Ambulatory Visit: Payer: 59 | Admitting: Family Medicine

## 2018-05-22 NOTE — Telephone Encounter (Signed)
Patient came in the office on 05/21/2018 to be seen and evaluated by Dr. Denyse Amass. Rhonda Cunningham,CMA

## 2018-06-28 ENCOUNTER — Telehealth: Payer: Self-pay

## 2018-06-28 NOTE — Telephone Encounter (Signed)
Pt has been updated regarding denial of med refill & provider's note. Pt asked to be transferred to scheduling desk to discuss matter with provider. No other inquiries during call.

## 2018-06-28 NOTE — Telephone Encounter (Signed)
Pt / [facility] pharmacy requesting med refills for clonazepam.

## 2018-06-28 NOTE — Telephone Encounter (Signed)
Not yet due - Rx are for 90 days. Last filled 04/25/2018 and not due until 07/24/2018

## 2018-07-18 ENCOUNTER — Telehealth (INDEPENDENT_AMBULATORY_CARE_PROVIDER_SITE_OTHER): Payer: 59 | Admitting: Osteopathic Medicine

## 2018-07-18 DIAGNOSIS — M25569 Pain in unspecified knee: Secondary | ICD-10-CM

## 2018-07-18 MED ORDER — NAPROXEN 500 MG PO TABS: 500.0000 mg | ORAL_TABLET | Freq: Two times a day (BID) | ORAL | 3 refills | Status: AC

## 2018-07-18 MED ORDER — TRAMADOL HCL 50 MG PO TABS: ORAL_TABLET | ORAL | 0 refills | Status: DC

## 2018-07-18 NOTE — Telephone Encounter (Signed)
I called Omeir back,  Yesterday suffered a knee injury after jumping into a pool. He denies any pop. He notes that it did swell.   Treated with ice, rest elevation and compression knee sleeve with hinged knee brace.    He thinks he has reinjured his knee.  He denies severe swelling or severe pain.  He is able to walk.  He notes that he is trying to return to work and would like some naproxen and some potential medicine for severe pain if needed.  He is not currently taking Klonopin.  To schedule follow-up visit in the near future but in the meantime will prescribe naproxen and limited tramadol.  Cautions reviewed.  Warned against taking tramadol with driving or operating machinery.   PDMP reviewed during this encounter.   Total time spent 11 minutes

## 2018-07-18 NOTE — Telephone Encounter (Signed)
Spoke with Pt. Advised him to use ice/heat and to elevate the knee. He does have brace and will use that. He has been using the Voltaren gel and tylenol. Not helping the pain. Does have OV on Monday for eval.   Pt questions if there is something he can use for pain over the weekend. Pharmacy on file correct.

## 2018-07-18 NOTE — Telephone Encounter (Signed)
Patient states that MCL is torn. Would like to speak to Dr.Corey about this and what the plan is or next step. Made an appointment for Monday, 04/13 but he would like to see if he can get a call before then. Please advise.

## 2018-07-18 NOTE — Telephone Encounter (Signed)
Pt reports he was treated for this knee previously and was doing better. Then yesterday he hurt it while doing a cannonball into a swimming pool. Immediate pain and edema.

## 2018-07-22 ENCOUNTER — Ambulatory Visit: Payer: 59 | Admitting: Family Medicine

## 2018-08-22 ENCOUNTER — Ambulatory Visit (INDEPENDENT_AMBULATORY_CARE_PROVIDER_SITE_OTHER): Payer: 59 | Admitting: Osteopathic Medicine

## 2018-08-22 ENCOUNTER — Encounter: Payer: Self-pay | Admitting: Osteopathic Medicine

## 2018-08-22 VITALS — BP 119/63 | HR 85 | Temp 97.1°F | Ht 71.65 in | Wt 198.2 lb

## 2018-08-22 DIAGNOSIS — Z0000 Encounter for general adult medical examination without abnormal findings: Secondary | ICD-10-CM | POA: Diagnosis not present

## 2018-08-22 DIAGNOSIS — Z0189 Encounter for other specified special examinations: Secondary | ICD-10-CM

## 2018-08-22 DIAGNOSIS — Z008 Encounter for other general examination: Secondary | ICD-10-CM

## 2018-08-22 DIAGNOSIS — R079 Chest pain, unspecified: Secondary | ICD-10-CM | POA: Diagnosis not present

## 2018-08-22 DIAGNOSIS — F411 Generalized anxiety disorder: Secondary | ICD-10-CM | POA: Diagnosis not present

## 2018-08-22 MED ORDER — CLONAZEPAM 0.5 MG PO TABS: ORAL_TABLET | ORAL | 1 refills | Status: DC

## 2018-08-22 NOTE — Progress Notes (Signed)
HPI: Jesse Hoover is a 28 y.o. male who  has a past medical history of Generalized anxiety disorder (06/19/2017).  he presents to [facility] today, 08/22/18,  for chief complaint of: Annual physical/biometric screening Chest pain     Patient here for annual physical / wellness exam.  See preventive care reviewed as below.    Additional concerns today include:   Chest pain . Context: Has had episodes of chest pain in the past, typically lasting about a few seconds at a time, maybe a couple times per week or month, does not really think anything of it but the other day he had an episode that lasted about 3 minutes.  Described as sharp/tightening pain.  With a longer episode, he reported some feeling of shortness of breath.  He is very nervous about this, concerned about coronary artery health, would like to be referred to cardiologist.   Past medical, surgical, social and family history reviewed:  Patient Active Problem List   Diagnosis Date Noted  . Generalized anxiety disorder 06/19/2017    No past surgical history on file.  Social History   Tobacco Use  . Smoking status: Never Smoker  . Smokeless tobacco: Never Used  Substance Use Topics  . Alcohol use: Yes    Comment: 4-5 drinks once a mth    Family History  Problem Relation Age of Onset  . Alcoholism Maternal Grandmother   . Leukemia Maternal Grandmother   . Esophageal cancer Maternal Grandfather   . Lung cancer Maternal Grandfather      Current medication list and allergy/intolerance information reviewed:    Current Outpatient Medications  Medication Sig Dispense Refill  . clonazePAM (KLONOPIN) 0.5 MG tablet Take 0.5-1 tablets po bid prn severe anxiety on work days. Limit use to prevent dependence. #45 for ninety days 45 tablet 1  . diclofenac sodium (VOLTAREN) 1 % GEL Apply 4 g topically 4 (four) times daily. To affected joint. 100 g 11  . naproxen (NAPROSYN) 500 MG  tablet Take 1 tablet (500 mg total) by mouth 2 (two) times daily with a meal. 30 tablet 3  . traMADol (ULTRAM) 50 MG tablet 1-2 tabs by mouth Q8 hours, maximum 6 tabs per day. 20 tablet 0   No current facility-administered medications for this visit.     Allergies  Allergen Reactions  . Bee Pollen Swelling      Review of Systems:  Constitutional:  No  fever, no chills, No recent illness, No unintentional weight changes. No significant fatigue.   HEENT: No  headache, no vision change, no hearing change, No sore throat, No  sinus pressure  Cardiac: +chest pain, No  pressure, No palpitations, No  Orthopnea  Respiratory:  No  shortness of breath. No  Cough  Gastrointestinal: No  abdominal pain, No  nausea, No  vomiting,  No  blood in stool, No  diarrhea, No  constipation   Musculoskeletal: No new myalgia/arthralgia  Skin: No  Rash, No other wounds/concerning lesions  Genitourinary: No  incontinence, No  abnormal genital bleeding, No abnormal genital discharge  Hem/Onc: No  easy bruising/bleeding, No  abnormal lymph node  Endocrine: No cold intolerance,  No heat intolerance. No polyuria/polydipsia/polyphagia   Neurologic: No  weakness, No  dizziness, No  slurred speech/focal weakness/facial droop  Psychiatric: No  concerns with depression, No  concerns with anxiety, No sleep problems, No mood problems  Exam:  BP 119/63 (BP Location: Left Arm, Patient Position: Sitting, Cuff Size:  Normal)   Pulse 85   Temp (!) 97.1 F (36.2 C) (Oral)   Ht 5' 11.65" (1.82 m)   Wt 198 lb 3.2 oz (89.9 kg)   BMI 27.14 kg/m   Constitutional: VS see above. General Appearance: alert, well-developed, well-nourished, NAD  Eyes: Normal lids and conjunctive, non-icteric sclera  Ears, Nose, Mouth, Throat: MMM, Normal external inspection ears/nares/mouth/lips/gums. TM normal bilaterally. Pharynx/tonsils no erythema, no exudate. Nasal mucosa normal.   Neck: No masses, trachea midline. No thyroid  enlargement. No tenderness/mass appreciated. No lymphadenopathy  Respiratory: Normal respiratory effort. no wheeze, no rhonchi, no rales  Cardiovascular: S1/S2 normal, no murmur, no rub/gallop auscultated. RRR. No lower extremity edema.   Gastrointestinal: Nontender, no masses. No hepatomegaly, no splenomegaly. No hernia appreciated. Bowel sounds normal. Rectal exam deferred.   Musculoskeletal: Gait normal. No clubbing/cyanosis of digits.   Neurological: Normal balance/coordination. No tremor. No cranial nerve deficit on limited exam. Motor and sensation intact and symmetric. Cerebellar reflexes intact.   Skin: warm, dry, intact. No rash/ulcer. No concerning nevi or subq nodules on limited exam.    Psychiatric: Normal judgment/insight. Normal mood and affect. Oriented x3.      EKG interpretation: Rate: 68 Rhythm: sinus No ST/T changes concerning for acute ischemia/infarct  Previous EKG none           ASSESSMENT/PLAN: The primary encounter diagnosis was Annual physical exam. Diagnoses of Generalized anxiety disorder, Encounter for biometric screening, and Chest pain in adult were also pertinent to this visit.   Orders Placed This Encounter  Procedures  . CBC  . COMPLETE METABOLIC PANEL WITH GFR  . Lipid panel  . TSH  . Ambulatory referral to Cardiology  . Exercise Tolerance Test  . Cardiac event monitor  . EKG 12-Lead    Meds ordered this encounter  Medications  . clonazePAM (KLONOPIN) 0.5 MG tablet    Sig: Take 0.5-1 tablets po bid prn severe anxiety on work days. Limit use to prevent dependence. #45 for ninety days    Dispense:  45 tablet    Refill:  1    Patient Instructions  Chest pain: This sounds like PVC or Premature Ventricular Contractions, which can cause a momentary squeezing/sharp pain in some people, does NOT correlate to blockages in the coronary arteries. Other possibilities are muscle spasm in intercostals (between the ribs) or pectoral  muscles, maybe acid reflux though this seems less likely. Sometimes we can catch PVC's on EKG, or a long-term heart monitor is often helpful. We can order an exercise stress test to give [place] a good idea of the health of your coronary arteries, but other specialized testing would be up to a cardiologist. I can order the monitor and the exercise stress test and place a referral to cardiology.    General Preventive Care  Most recent routine screening lipids/other labs: ordered today.   Everyone should have blood pressure checked once per year. Goal 130/80  Tobacco: don't!   Alcohol: responsible moderation is ok for most adults - if you have concerns about your alcohol intake, please talk to me!   Exercise: as tolerated to reduce risk of cardiovascular disease and diabetes. Strength training will also prevent osteoporosis.   Mental health: if need for mental health care (medicines, counseling, other), or concerns about moods, please let me know!   Sexual health: if ever a need for STD testing, or if concerns with libido/pain problems, please let me know!   Advanced Directive: Living Will and/or Healthcare Power of [employer]  recommended for all adults, regardless of age or health.  Vaccines  Flu vaccine: recommended for almost everyone, every fall.   Shingles vaccine: Shingrix recommended after age 39.   Pneumonia vaccines: Prevnar and Pneumovax recommended after age 7, or sooner if certain medical conditions.  Tetanus booster: Tdap recommended every 10 years. Due 2029 Cancer screenings   Colon cancer screening: recommended for everyone at age 59, but some folks need a colonoscopy sooner if risk factors   Prostate cancer screening: PSA blood test around age 71  Lung cancer screening: not needed for non-smokers  Infection screenings . HIV, Gonorrhea/Chlamydia: screening as needed. . Hepatitis C: recommended for anyone born 37-1966 . TB: certain at-risk populations, or depending on  work requirements and/or travel history Other . Bone Density Test: recommended for men at age 43, sooner depending on risk factors . Abdominal Aortic Aneurysm: screening with ultrasound recommended once for men age 17-75 who have ever smoked     Please note: Preventive care issues were addressed today as part of your annual wellness physical, and this care should be covered under your insurance. However there were other medical issues which were also addressed today, and insurance may bill you separately for a "problem-based visit" for this care: chest pain. Any questions or concerns about charges which may appear on your billing statement should be directed to your insurance company or to [facility] billing department, and they will contact our office if there are further concerns.          Visit summary with medication list and pertinent instructions was printed for patient to review. All questions at time of visit were answered - patient instructed to contact office with any additional concerns or updates. ER/RTC precautions were reviewed with the patient.     Please note: voice recognition software was used to produce this document, and typos may escape review. Please contact Dr. Lyn Hollingshead for any needed clarifications.    Return for annual physical one year, sooner as needed .

## 2018-08-22 NOTE — Patient Instructions (Addendum)
Chest pain: This sounds like PVC or Premature Ventricular Contractions, which can cause a momentary squeezing/sharp pain in some people, does NOT correlate to blockages in the coronary arteries. Other possibilities are muscle spasm in intercostals (between the ribs) or pectoral muscles, maybe acid reflux though this seems less likely. Sometimes we can catch PVC's on EKG, or a long-term heart monitor is often helpful. We can order an exercise stress test to give [place] a good idea of the health of your coronary arteries, but other specialized testing would be up to a cardiologist. I can order the monitor and the exercise stress test and place a referral to cardiology.    General Preventive Care  Most recent routine screening lipids/other labs: ordered today.   Everyone should have blood pressure checked once per year. Goal 130/80  Tobacco: don't!   Alcohol: responsible moderation is ok for most adults - if you have concerns about your alcohol intake, please talk to me!   Exercise: as tolerated to reduce risk of cardiovascular disease and diabetes. Strength training will also prevent osteoporosis.   Mental health: if need for mental health care (medicines, counseling, other), or concerns about moods, please let me know!   Sexual health: if ever a need for STD testing, or if concerns with libido/pain problems, please let me know!   Advanced Directive: Living Will and/or Healthcare Power of Attorney recommended for all adults, regardless of age or health.  Vaccines  Flu vaccine: recommended for almost everyone, every fall.   Shingles vaccine: Shingrix recommended after age 72.   Pneumonia vaccines: Prevnar and Pneumovax recommended after age 17, or sooner if certain medical conditions.  Tetanus booster: Tdap recommended every 10 years. Due 2029 Cancer screenings   Colon cancer screening: recommended for everyone at age 53, but some folks need a colonoscopy sooner if risk factors   Prostate  cancer screening: PSA blood test around age 65  Lung cancer screening: not needed for non-smokers  Infection screenings . HIV, Gonorrhea/Chlamydia: screening as needed. . Hepatitis C: recommended for anyone born 32-1966 . TB: certain at-risk populations, or depending on work requirements and/or travel history Other . Bone Density Test: recommended for men at age 62, sooner depending on risk factors . Abdominal Aortic Aneurysm: screening with ultrasound recommended once for men age 60-75 who have ever smoked     Please note: Preventive care issues were addressed today as part of your annual wellness physical, and this care should be covered under your insurance. However there were other medical issues which were also addressed today, and insurance may bill you separately for a "problem-based visit" for this care: chest pain. Any questions or concerns about charges which may appear on your billing statement should be directed to your insurance company or to [facility] billing department, and they will contact our office if there are further concerns.

## 2018-08-23 LAB — CBC
HCT: 43.4 % (ref 38.5–50.0)
Hemoglobin: 15 g/dL (ref 13.2–17.1)
MCH: 30.6 pg (ref 27.0–33.0)
MCHC: 34.6 g/dL (ref 32.0–36.0)
MCV: 88.6 fL (ref 80.0–100.0)
MPV: 10.4 fL (ref 7.5–12.5)
Platelets: 205 10*3/uL (ref 140–400)
RBC: 4.9 10*6/uL (ref 4.20–5.80)
RDW: 12.3 % (ref 11.0–15.0)
WBC: 4.2 10*3/uL (ref 3.8–10.8)

## 2018-08-23 LAB — COMPLETE METABOLIC PANEL WITH GFR
AG Ratio: 2.3 (calc) (ref 1.0–2.5)
ALT: 20 U/L (ref 9–46)
AST: 23 U/L (ref 10–40)
Albumin: 4.9 g/dL (ref 3.6–5.1)
Alkaline phosphatase (APISO): 49 U/L (ref 36–130)
BUN: 20 mg/dL (ref 7–25)
CO2: 27 mmol/L (ref 20–32)
Calcium: 9.7 mg/dL (ref 8.6–10.3)
Chloride: 104 mmol/L (ref 98–110)
Creat: 1.13 mg/dL (ref 0.60–1.35)
GFR, Est African American: 102 mL/min/{1.73_m2} (ref >=60)
GFR, Est Non African American: 88 mL/min/{1.73_m2} (ref >=60)
Globulin: 2.1 g/dL (calc) (ref 1.9–3.7)
Glucose, Bld: 94 mg/dL (ref 65–99)
Potassium: 4.6 mmol/L (ref 3.5–5.3)
Sodium: 138 mmol/L (ref 135–146)
Total Bilirubin: 0.9 mg/dL (ref 0.2–1.2)
Total Protein: 7 g/dL (ref 6.1–8.1)

## 2018-08-23 LAB — LIPID PANEL
Cholesterol: 130 mg/dL (ref <200)
HDL: 51 mg/dL (ref >=40)
LDL Cholesterol (Calc): 69 mg/dL (calc)
Non-HDL Cholesterol (Calc): 79 mg/dL (calc) (ref <130)
Total CHOL/HDL Ratio: 2.5 (calc) (ref <5.0)
Triglycerides: 36 mg/dL (ref <150)

## 2018-08-23 LAB — TSH: TSH: 1.11 mIU/L (ref 0.40–4.50)

## 2019-01-22 ENCOUNTER — Ambulatory Visit (INDEPENDENT_AMBULATORY_CARE_PROVIDER_SITE_OTHER): Payer: 59 | Admitting: Family Medicine

## 2019-01-22 ENCOUNTER — Other Ambulatory Visit: Payer: Self-pay

## 2019-01-22 DIAGNOSIS — J0100 Acute maxillary sinusitis, unspecified: Secondary | ICD-10-CM | POA: Diagnosis not present

## 2019-01-22 MED ORDER — AZELASTINE HCL 0.1 % NA SOLN: 2.0000 | Freq: Two times a day (BID) | NASAL | 12 refills | Status: DC

## 2019-01-22 MED ORDER — CEFDINIR 300 MG PO CAPS: 300.0000 mg | ORAL_CAPSULE | Freq: Two times a day (BID) | ORAL | 0 refills | Status: DC

## 2019-01-22 NOTE — Progress Notes (Signed)
Virtual Visit  via Video Note  I connected with      Jesse Hoover by a video enabled telemedicine application and verified that I am speaking with the correct person using two identifiers.   I discussed the limitations of evaluation and management by telemedicine and the availability of in person appointments. The patient expressed understanding and agreed to proceed.  History of Present Illness: Jesse Hoover is a 28 y.o. male who would like to discuss sinus pressure.  Patient has a 4 to 22-month history of sinus pressure associate with nasal drainage.  Sometimes he has a productive cough in the morning.  He denies significant wheezing or shortness of breath.  He denies any sneezing or itchy watery eyes.  He is tried some over-the-counter Flonase nasal spray as well as diphenhydramine which helps a little.  He also tries taking some tea with honey at times which help a little.  He notes his symptoms worsen a little bit recently.  He is concerned he may have a bacterial sinus infection.  He denies fevers chills shortness of breath or body aches. No COVID exposures.  Observations/Objective: There were no vitals taken for this visit. Wt Readings from Last 5 Encounters:  08/22/18 198 lb 3.2 oz (89.9 kg)  05/20/18 201 lb (91.2 kg)  01/24/18 206 lb 14.4 oz (93.8 kg)  07/17/17 194 lb 0.6 oz (88 kg)  06/19/17 197 lb 1.9 oz (89.4 kg)   Exam: Appearance nontoxic Normal Speech.  No tachypnea or wheezing.  Alert and oriented.  Lab and Radiology Results No results found for this or any previous visit (from the past 72 hour(s)). No results found.   Assessment and Plan: 28 y.o. male with sinusitis likely.  Failing typical conservative management.  Will add Zyrtec and try using Astelin nasal spray.  We will also use a course of Omnicef antibiotics.  Recheck in person if not improving.  PDMP not reviewed this encounter. No orders of the defined types were placed in this encounter.  Meds  ordered this encounter  Medications  . azelastine (ASTELIN) 0.1 % nasal spray    Sig: Place 2 sprays into both nostrils 2 (two) times daily. Use in each nostril as directed    Dispense:  30 mL    Refill:  12  . cefdinir (OMNICEF) 300 MG capsule    Sig: Take 1 capsule (300 mg total) by mouth 2 (two) times daily.    Dispense:  14 capsule    Refill:  0    Follow Up Instructions:    I discussed the assessment and treatment plan with the patient. The patient was provided an opportunity to ask questions and all were answered. The patient agreed with the plan and demonstrated an understanding of the instructions.   The patient was advised to call back or seek an in-person evaluation if the symptoms worsen or if the condition fails to improve as anticipated.  Time: 15 minutes of intraservice time, with >22 minutes of total time during today's visit.      Historical information moved to improve visibility of documentation.  Past Medical History:  Diagnosis Date  . Generalized anxiety disorder 06/19/2017   No past surgical history on file. Social History   Tobacco Use  . Smoking status: Never Smoker  . Smokeless tobacco: Never Used  Substance Use Topics  . Alcohol use: Yes    Comment: 4-5 drinks once a mth   family history includes Alcoholism in his maternal  grandmother; Esophageal cancer in his maternal grandfather; Leukemia in his maternal grandmother; Lung cancer in his maternal grandfather.  Medications: Current Outpatient Medications  Medication Sig Dispense Refill  . azelastine (ASTELIN) 0.1 % nasal spray Place 2 sprays into both nostrils 2 (two) times daily. Use in each nostril as directed 30 mL 12  . cefdinir (OMNICEF) 300 MG capsule Take 1 capsule (300 mg total) by mouth 2 (two) times daily. 14 capsule 0  . clonazePAM (KLONOPIN) 0.5 MG tablet Take 0.5-1 tablets po bid prn severe anxiety on work days. Limit use to prevent dependence. #45 for ninety days 45 tablet 1  .  diclofenac sodium (VOLTAREN) 1 % GEL Apply 4 g topically 4 (four) times daily. To affected joint. 100 g 11  . naproxen (NAPROSYN) 500 MG tablet Take 1 tablet (500 mg total) by mouth 2 (two) times daily with a meal. 30 tablet 3  . traMADol (ULTRAM) 50 MG tablet 1-2 tabs by mouth Q8 hours, maximum 6 tabs per day. 20 tablet 0   No current facility-administered medications for this visit.    Allergies  Allergen Reactions  . Bee Pollen Swelling

## 2019-02-26 ENCOUNTER — Other Ambulatory Visit: Payer: Self-pay | Admitting: Osteopathic Medicine

## 2019-02-26 DIAGNOSIS — F411 Generalized anxiety disorder: Secondary | ICD-10-CM

## 2019-02-26 MED ORDER — SODIUM CHLORIDE 0.9 % IV SOLN
10.00 | INTRAVENOUS | Status: DC
Start: ? — End: 2019-02-26

## 2019-02-26 MED ORDER — Medication
Status: DC
Start: ? — End: 2019-02-26

## 2019-02-27 ENCOUNTER — Telehealth: Payer: Self-pay

## 2019-02-27 ENCOUNTER — Other Ambulatory Visit: Payer: Self-pay | Admitting: Osteopathic Medicine

## 2019-02-27 DIAGNOSIS — F411 Generalized anxiety disorder: Secondary | ICD-10-CM

## 2019-02-27 MED ORDER — CLONAZEPAM 0.5 MG PO TABS: ORAL_TABLET | ORAL | 1 refills | Status: DC

## 2019-02-27 NOTE — Telephone Encounter (Signed)
He is due for a refill on Clonazepam.

## 2019-02-27 NOTE — Telephone Encounter (Signed)
I had in his chart that the patient wasn't taking those sertraline, I'm ok to restart it but can we confirm that he has asked for a refill on this?  I believe he has a follow-up on Monday, can we ensure that this is a virtual visit for anxiety follow-up, he does not need to be seen in the office if we can help it Thanks!

## 2019-02-27 NOTE — Telephone Encounter (Signed)
Jesse Hoover requesting med refill via fax for clonazepam.

## 2019-02-27 NOTE — Telephone Encounter (Signed)
Already done

## 2019-02-27 NOTE — Telephone Encounter (Signed)
Patient called, stating that he had a panic attack and was seen in the ER for this. He has an appointment on 03/03/19 to discuss anxiety and paperwork with PCP. Patient states that he is out of his medication for this issue and would like a refill on it until his scheduled appt on Monday. Please Advise.

## 2019-02-27 NOTE — Telephone Encounter (Signed)
Patient would like to go back on the Sertraline daily. He will talk about it more on Monday.

## 2019-03-03 ENCOUNTER — Ambulatory Visit (INDEPENDENT_AMBULATORY_CARE_PROVIDER_SITE_OTHER): Payer: 59 | Admitting: Osteopathic Medicine

## 2019-03-03 ENCOUNTER — Encounter: Payer: Self-pay | Admitting: Osteopathic Medicine

## 2019-03-03 DIAGNOSIS — F411 Generalized anxiety disorder: Secondary | ICD-10-CM | POA: Diagnosis not present

## 2019-03-03 DIAGNOSIS — F410 Panic disorder [episodic paroxysmal anxiety] without agoraphobia: Secondary | ICD-10-CM

## 2019-03-03 MED ORDER — CLONAZEPAM 0.5 MG PO TABS: ORAL_TABLET | ORAL | 1 refills | Status: DC

## 2019-03-03 MED ORDER — ESCITALOPRAM OXALATE 10 MG PO TABS: 10.0000 mg | ORAL_TABLET | Freq: Every day | ORAL | 1 refills | Status: DC

## 2019-03-03 NOTE — Progress Notes (Signed)
Called pt at 1155 am, no answer. Left a vm msg.

## 2019-03-03 NOTE — Patient Instructions (Signed)
Will trial Lexapro 10 mg instead of Zoloft 25-50 mg, this may hel the anxiety a bit more, should hopefully start to notice improvement in a couple weeks, with full effects taking about 4-6 weeks total. At that time, please send me a MyChart message (you'll get a reminder at that time) to update me on how you're doing. We have the option to increase to Lexapro 20 mg, switch / add medication depending on how you're doing.   OK to briefly increase Clonazepam frequency to daily (#30 pills for 30 days), but would like to back off on this over the next 1-2 months to sparing use for emergencies (#30-45 pills doe 90 days).   We have the option to refer to psychiatry to help with medication management, especially if you're concerned about ADHD component to your symptoms.   Would strongly consider therapy/counseling, let me know if you'd like me to place referral for a therapist.   For immediate mental health services:  [facility], [address], [place], [place] [address], [facility], [address] [place], [place] [address], [phone]  Any emergency room  [facility] [place], [phone]

## 2019-03-03 NOTE — Progress Notes (Signed)
Virtual Visit via Video (App used: Doximity) Note  I connected with      Ashok Croonyler J Aguallo on 03/03/19 at 1:00 PM by a telemedicine application and verified that I am speaking with the correct person using two identifiers.  Patient is at [employer] I am in office   I discussed the limitations of evaluation and management by telemedicine and the availability of in person appointments. The patient expressed understanding and agreed to proceed.  History of Present Illness: Ashok Croonyler J Cadenhead is a 28 y.o. male who would like to discuss ER follow-up, anxiety   Recently seen in ER for chest pain and upper abdominal pain x30 mins PTA w/ associated nausea, ER d/c home w/ instructions to see cardio and PCP. Pt last seen by me 08/2018 and had c/o atypical CP at that time and I referred him to cardiology at that time but I can't see he ever went. Rx for Clonazepam 0.5 #45 x90 days. Increased anxiety at work, he had been on Zoloft as well  which per his report was working fine for some time (Rx initially written 06/2017, last refill was #90 w/ 3 refills in 01/2018). We resent Rx for him.   Anxious, on edge, easily annoyed. Symptoms daily.   Stopped Zoloft September. Today he'd like to talk about changing medications, don't seem to be working. He doubled up on his Zoloft when he recently restarted it.   Missed 3 days d/t anxiety  02/26/19 -02/28/19   GAD 7 : Generalized Anxiety Score 03/03/2019 06/19/2017  Nervous, Anxious, on Edge 3 2  Control/stop worrying 3 1  Worry too much - different things 3 1  Trouble relaxing 3 1  Restless 3 0  Easily annoyed or irritable 3 1  Afraid - awful might happen 3 1  Total GAD 7 Score 21 7  Anxiety Difficulty Very difficult -   [facility] 03/03/2019 06/19/2017  Decreased Interest 0 0  Down, Depressed, Hopeless 0 0  PHQ - 2 Score 0 0  Altered sleeping 0 0  Tired, decreased energy 1 0  Change in appetite 0 1  Feeling bad or failure about  yourself  1 0  Trouble concentrating 1 2  Moving slowly or fidgety/restless 1 0  Suicidal thoughts 0 0  PHQ-9 Score 4 3  Difficult doing work/chores Very difficult Somewhat difficult       Observations/Objective: There were no vitals taken for this visit. BP Readings from Last 3 Encounters:  08/22/18 119/63  05/20/18 (!) 130/54  05/13/18 119/62   Exam: Normal Speech.  NAD  Lab and Radiology Results No results found for this or any previous visit (from the past 72 hour(s)). No results found.     Assessment and Plan: 28 y.o. male with The encounter diagnosis was Generalized anxiety disorder.  Long discussion about options see pt instructions   PDMP reviewed during this encounter. No orders of the defined types were placed in this encounter.  Meds ordered this encounter  Medications  . escitalopram (LEXAPRO) 10 MG tablet    Sig: Take 1 tablet (10 mg total) by mouth at bedtime.    Dispense:  90 tablet    Refill:  1  . clonazePAM (KLONOPIN) 0.5 MG tablet    Sig: Take 0.5-1 tablets po bid prn severe anxiety on work days. Limit use to prevent dependence. #30 for 30 days    Dispense:  30 tablet    Refill:  1   Patient  Instructions  Will trial Lexapro 10 mg instead of Zoloft 25-50 mg, this may hel the anxiety a bit more, should hopefully start to notice improvement in a couple weeks, with full effects taking about 4-6 weeks total. At that time, please send me a MyChart message (you'll get a reminder at that time) to update me on how you're doing. We have the option to increase to Lexapro 20 mg, switch / add medication depending on how you're doing.   OK to briefly increase Clonazepam frequency to daily (#30 pills for 30 days), but would like to back off on this over the next 1-2 months to sparing use for emergencies (#30-45 pills doe 90 days).   We have the option to refer to psychiatry to help with medication management, especially if you're concerned about ADHD component  to your symptoms.   Would strongly consider therapy/counseling, let me know if you'd like me to place referral for a therapist.   For immediate mental health services:  [facility], [address] [place], [place] [address], [facility], [address], [place], [place] [address], [phone]  Any emergency room  National Suicide Prevention Lifeline, [phone]      Instructions sent via [place]. If MyChart not available, pt was given option for info via personal e-mail w/ no guarantee of protected health info over unsecured e-mail communication, and MyChart sign-up instructions were sent to patient.   Follow Up Instructions: Return in about 4 weeks (around 03/31/2019) for MyChart follow-up (reminder set in [place]) .    I discussed the assessment and treatment plan with the patient. The patient was provided an opportunity to ask questions and all were answered. The patient agreed with the plan and demonstrated an understanding of the instructions.   The patient was advised to call back or seek an in-person evaluation if any new concerns, if symptoms worsen or if the condition fails to improve as anticipated.  40 minutes of non-face-to-face time was provided during this encounter.      . . . . . . . . . . . . . Marland Kitchen                   Historical information moved to improve visibility of documentation.  Past Medical History:  Diagnosis Date  . Generalized anxiety disorder 06/19/2017   No past surgical history on file. Social History   Tobacco Use  . Smoking status: Never Smoker  . Smokeless tobacco: Never Used  Substance Use Topics  . Alcohol use: Yes    Comment: 4-5 drinks once a mth   family history includes Alcoholism in his maternal grandmother; Esophageal cancer in his maternal grandfather; Leukemia in his maternal grandmother; Lung cancer in his maternal  grandfather.  Medications: Current Outpatient Medications  Medication Sig Dispense Refill  . clonazePAM (KLONOPIN) 0.5 MG tablet Take 0.5-1 tablets po bid prn severe anxiety on work days. Limit use to prevent dependence. #30 for 30 days 30 tablet 1  . diclofenac sodium (VOLTAREN) 1 % GEL Apply 4 g topically 4 (four) times daily. To affected joint. 100 g 11  . naproxen (NAPROSYN) 500 MG tablet Take 1 tablet (500 mg total) by mouth 2 (two) times daily with a meal. 30 tablet 3  . sertraline (ZOLOFT) 25 MG tablet Take 1 tablet by mouth once daily 30 tablet 0  . escitalopram (LEXAPRO) 10 MG tablet Take 1 tablet (10 mg total) by mouth at bedtime. [place]  tablet 1   No current facility-administered medications for this visit.    Allergies  Allergen Reactions  . Bee Pollen Swelling

## 2019-03-04 ENCOUNTER — Telehealth: Payer: Self-pay | Admitting: Osteopathic Medicine

## 2019-03-04 DIAGNOSIS — F411 Generalized anxiety disorder: Secondary | ICD-10-CM

## 2019-03-04 NOTE — Telephone Encounter (Signed)
Pt has been updated of provider's note regarding FMLA paperwork. Pt will stop by the front office later on today to complete necessary missing information on forms. No other inquiries during call.

## 2019-03-04 NOTE — Telephone Encounter (Signed)
Receives disability/FMLA forms for patient.  There is one form that he needs to sign before we can release it, there is another where he needs to list his Social Security number and occupation.  Please have him come to the front desk to complete these tasks before we can fax out the papers.

## 2019-03-13 ENCOUNTER — Telehealth: Payer: Self-pay

## 2019-03-13 NOTE — Telephone Encounter (Signed)
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 My goal is to have it ready by end of day tomorrow.   I'll call him if I have any questions about filling it out - not sure why it wasn't accepted in the first place or what exactly they'd need now. He was not specific when he dropped this off.

## 2019-03-13 NOTE — Telephone Encounter (Signed)
Pt left a vm msg requesting an update on physician form he dropped off on Tuesday regarding CPE for work. Pt would like to stop by and p/u the completed form. Pt was made aware of form completion procedures. Pls advise, thanks.

## 2019-03-14 NOTE — Telephone Encounter (Signed)
Pt has been updated and is aware physical form/worker's comp form are available for pick up at front desk. Physical form faxed to Axel Filler at [phone] and [facility] forms faxed to [phone]. Confirmation rec'd. Copy of all forms placed in scan folder.

## 2019-08-21 ENCOUNTER — Encounter: Payer: Self-pay | Admitting: Family Medicine

## 2019-08-21 ENCOUNTER — Other Ambulatory Visit: Payer: Self-pay

## 2019-08-21 ENCOUNTER — Ambulatory Visit (INDEPENDENT_AMBULATORY_CARE_PROVIDER_SITE_OTHER): Payer: 59 | Admitting: Family Medicine

## 2019-08-21 DIAGNOSIS — S76319A Strain of muscle, fascia and tendon of the posterior muscle group at thigh level, unspecified thigh, initial encounter: Secondary | ICD-10-CM | POA: Insufficient documentation

## 2019-08-21 DIAGNOSIS — S76311A Strain of muscle, fascia and tendon of the posterior muscle group at thigh level, right thigh, initial encounter: Secondary | ICD-10-CM

## 2019-08-21 NOTE — Progress Notes (Signed)
HPI: Jesse Hoover is a 29 y.o. male who  has a past medical history of Generalized anxiety disorder (06/19/2017).  he presents to [facility] today, 08/22/19,  for chief complaint of: Annual physical exam  GAD- currently taking Lexapro nightly with some missed doses, no other daily maintenance medications for anxiety. Using clonazepam as needed, average use 1 tab daily.  Reports that his last prescription was supposed to be 30 tablets of clonazepam but he was only given 15 tablets.  Is not doing counseling. Denies SI/HI.  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  Past medical, surgical, social and family history reviewed:  Patient Active Problem List   Diagnosis Date Noted  . Hamstring strain 08/21/2019  . Generalized anxiety disorder 06/19/2017    Past Surgical History:  Procedure Laterality Date  . NO PAST SURGERIES      Social History   Tobacco Use  . Smoking status: Never Smoker  . Smokeless tobacco: Never Used  Substance Use Topics  . Alcohol use: Yes    Alcohol/week: 4.0 - 5.0 standard drinks    Types: 4 - 5 Shots of liquor per week    Family History  Problem Relation Age of Onset  . Alcoholism Maternal Grandmother   . Leukemia Maternal Grandmother   . Esophageal cancer Maternal Grandfather   . Lung cancer Maternal Grandfather   . Multiple myeloma Mother      Current medication list and allergy/intolerance information reviewed:    Current Outpatient Medications  Medication Sig Dispense Refill  . clonazePAM (KLONOPIN) 0.5 MG tablet Take 0.5-1 tablets po bid prn severe anxiety on work days. Limit use to prevent dependence. #30 for 30 days 30 tablet 1  . diclofenac sodium (VOLTAREN) 1 % GEL Apply 4 g topically 4 (four) times daily. To affected joint. 100 g 11  . escitalopram (LEXAPRO)  10 MG tablet Take 1 tablet (10 mg total) by mouth at bedtime. 90 tablet 1  . finasteride (PROSCAR) 5 MG tablet Take 0.25 tablets by mouth daily.    Marland Kitchen ketoconazole (NIZORAL) 2 % shampoo Apply 1 application topically 2 (two) times a week.     No current facility-administered medications for this visit.    Allergies  Allergen Reactions  . Bee Pollen Swelling      Review of Systems:  Constitutional:  No  fever, no chills, No recent illness, No unintentional weight changes. No significant fatigue.   HEENT: No  headache, no vision change, no hearing change, No sore throat, No  sinus pressure, + rhinorrhea  Cardiac: + faint chest pain, No  pressure, + palpitations, No  Orthopnea  Respiratory:  + occasional situational shortness of breath. No  Cough  Gastrointestinal: No  abdominal pain, No  nausea, No  vomiting,  No  blood in stool, No  diarrhea, No  constipation   Musculoskeletal: No new myalgia/arthralgia  Skin: No  Rash, No other wounds/concerning lesions  Genitourinary: No  incontinence, No  Abnormal urination, No abnormal genital discharge  Hem/Onc: No  easy bruising/bleeding, No  abnormal lymph node  Endocrine: No cold intolerance,  No heat intolerance. No polyuria/polydipsia/polyphagia   Neurologic: No  weakness, No  dizziness, No  slurred speech/focal weakness/facial droop  Psychiatric: No  concerns with depression, + concerns with anxiety, No sleep problems, No mood problems  Exam:  BP 121/66  Pulse (!) 57   Temp 97.9 F (36.6 C) (Oral)   Ht 6' (1.829 m)   Wt 200 lb 12.8 oz (91.1 kg)   SpO2 100%   BMI 27.23 kg/m   Constitutional: VS see above. General Appearance: alert, well-developed, well-nourished, NAD  Eyes: Normal lids and conjunctive, non-icteric sclera  Ears, Nose, Mouth, Throat: MMM, Normal external inspection ears/nares/mouth/lips/gums. TM normal bilaterally. Pharynx/tonsils, nasal mucosa exam deferred due to COVID precautions.   Neck: No masses,  trachea midline. No thyroid enlargement. No tenderness/mass appreciated. No lymphadenopathy  Respiratory: Normal respiratory effort. no wheeze, no rhonchi, no rales  Cardiovascular: S1/S2 normal, no murmur, no rub/gallop auscultated. RRR. No lower extremity edema. Pedal pulse II/IV bilaterally DP and PT. No carotid bruit or JVD. No abdominal aortic bruit.  Gastrointestinal: Mildly tender to deep palpation to RUQ and LLQ, no masses. No hepatomegaly, no splenomegaly. No hernia appreciated. Bowel sounds normal. Rectal exam deferred.   Musculoskeletal: Gait normal. No clubbing/cyanosis of digits.   Neurological: Normal balance/coordination. No tremor. No cranial nerve deficit on limited exam. Motor and sensation intact and symmetric. Cerebellar reflexes intact.   Skin: warm, dry, intact. No rash/ulcer. No concerning nevi or subq nodules on limited exam.    Psychiatric: Normal judgment/insight. Disjointed mood and affect with slow speech and mildly delayed responses. Oriented x3.    No results found for this or any previous visit (from the past 72 hour(s)).  No results found.   Assessment and Plan:  1. Annual physical exam Checking CBC, CMP, and Lipids today.  2. Generalized anxiety disorder Checking TSH today. Recommend discussion with PCP regarding anxiety management and medication use.  Discussed importance of counseling and management of mental health conditions.  Patient amenable to establishing a counseling relationship.  Referral to behavioral health order placed.  3. Screening for thyroid disorder Checking TSH today.  Orders Placed This Encounter  Procedures  . CBC  . COMPLETE METABOLIC PANEL WITH GFR  . Lipid panel  . TSH  . HIV Antibody (routine testing w rflx)  . Ambulatory referral to [facility]    No orders of the defined types were placed in this encounter.   Patient Instructions  Preventive Care 60-20 Years Old, Male Preventive care refers to lifestyle  choices and visits with your health care provider that can promote health and wellness. This includes:  A yearly physical exam. This is also called an annual well check.  Regular dental and eye exams.  Immunizations.  Screening for certain conditions.  Healthy lifestyle choices, such as eating a healthy diet, getting regular exercise, not using drugs or products that contain nicotine and tobacco, and limiting alcohol use. What can I expect for my preventive care visit? Physical exam Your health care provider will check:  Height and weight. These may be used to calculate body mass index (BMI), which is a measurement that tells if you are at a healthy weight.  Heart rate and blood pressure.  Your skin for abnormal spots. Counseling Your health care provider may ask you questions about:  Alcohol, tobacco, and drug use.  Emotional well-being.  Home and relationship well-being.  Sexual activity.  Eating habits.  Work and work Statistician. What immunizations do I need?  Influenza (flu) vaccine  This is recommended every year. Tetanus, diphtheria, and pertussis (Tdap) vaccine  You may need a Td booster every 10 years. Varicella (chickenpox) vaccine  You may need this vaccine if you have not already been vaccinated. Human papillomavirus (HPV) vaccine  If recommended by your health care provider, you may need three doses over 6 months. Measles, mumps, and rubella (MMR) vaccine  You may need at least one dose of MMR. You may also need a second dose. Meningococcal conjugate (MenACWY) vaccine  One dose is recommended if you are 65-59 years old and a Market researcher living in a residence hall, or if you have one of several medical conditions. You may also need additional booster doses. Pneumococcal conjugate (PCV13) vaccine  You may need this if you have certain conditions and were not previously vaccinated. Pneumococcal polysaccharide (PPSV23) vaccine  You  may need one or two doses if you smoke cigarettes or if you have certain conditions. Hepatitis A vaccine  You may need this if you have certain conditions or if you travel or work in places where you may be exposed to hepatitis A. Hepatitis B vaccine  You may need this if you have certain conditions or if you travel or work in places where you may be exposed to hepatitis B. Haemophilus influenzae type b (Hib) vaccine  You may need this if you have certain risk factors. You may receive vaccines as individual doses or as more than one vaccine together in one shot (combination vaccines). Talk with your health care provider about the risks and benefits of combination vaccines. What tests do I need? Blood tests  Lipid and cholesterol levels. These may be checked every 5 years starting at age 26.  Hepatitis C test.  Hepatitis B test. Screening   Diabetes screening. This is done by checking your blood sugar (glucose) after you have not eaten for a while (fasting).  Sexually transmitted disease (STD) testing. Talk with your health care provider about your test results, treatment options, and if necessary, the need for more tests. Follow these instructions at home: Eating and drinking   Eat a diet that includes fresh fruits and vegetables, whole grains, lean protein, and low-fat dairy products.  Take vitamin and mineral supplements as recommended by your health care provider.  Do not drink alcohol if your health care provider tells you not to drink.  If you drink alcohol: ? Limit how much you have to 0-2 drinks a day. ? Be aware of how much alcohol is in your drink. In the [place] one drink equals one 12 oz bottle of beer (355 mL), one 5 oz glass of wine (148 mL), or one 1 oz glass of hard liquor (44 mL). Lifestyle  Take daily care of your teeth and gums.  Stay active. Exercise for at least 30 minutes on 5 or more days each week.  Do not use any products that contain nicotine or  tobacco, such as cigarettes, e-cigarettes, and chewing tobacco. If you need help quitting, ask your health care provider.  If you are sexually active, practice safe sex. Use a condom or other form of protection to prevent STIs (sexually transmitted infections). What's next?  Go to your health care provider once a year for a well check visit.  Ask your health care provider how often you should have your eyes and teeth checked.  Stay up to date on all vaccines. This information is not intended to replace advice given to you by your health care provider. Make sure you discuss any questions you have with your health care provider. Document Revised: 03/21/2018 Document Reviewed: 03/21/2018 Elsevier Patient Education  [place].   Follow-up plan: Return in about 1 year (around 08/21/2020) for annual physical exam.  Clearnce Sorrel, DNP, APRN, FNP-BC [facility]

## 2019-08-21 NOTE — Progress Notes (Signed)
Jesse Hoover - 29 y.o. male MRN [id number]  Date of birth: 01-Jun-1990  Subjective Chief Complaint  Patient presents with  . Leg Pain    HPI Jesse Hoover is a 29 y.o. male here today with complaint of R posterior thigh pain.  Reports noticing thigh pain a few days ago.  Also has bruise along mid posterior thigh. Works on an Futures trader heavy parts, doesn't always use proper lifting technique.  Denies any other known injury or overuse.  Denies weakness of leg.    ROS:  A comprehensive ROS was completed and negative except as noted per HPI  Allergies  Allergen Reactions  . Bee Pollen Swelling    Past Medical History:  Diagnosis Date  . Generalized anxiety disorder 06/19/2017    No past surgical history on file.  Social History   Socioeconomic History  . Marital status: Single    Spouse name: Katie  . Number of children: 2  . Years of education: Not on file  . Highest education level: Not on file  Occupational History  . Occupation: Tech  Tobacco Use  . Smoking status: Never Smoker  . Smokeless tobacco: Never Used  Substance and Sexual Activity  . Alcohol use: Yes    Comment: 4-5 drinks once a mth  . Drug use: No  . Sexual activity: Yes    Partners: Female    Birth control/protection: None  Other Topics Concern  . Not on file  Social History Narrative  . Not on file   Social Determinants of Health   Financial Resource Strain:   . Difficulty of Paying Living Expenses:   Food Insecurity:   . Worried About Programme researcher, broadcasting/film/video in the Last Year:   . Barista in the Last Year:   Transportation Needs:   . Freight forwarder (Medical):   Marland Kitchen Lack of Transportation (Non-Medical):   Physical Activity:   . Days of Exercise per Week:   . Minutes of Exercise per Session:   Stress:   . Feeling of Stress :   Social Connections:   . Frequency of Communication with Friends and Family:   . Frequency of Social Gatherings with Friends and Family:   .  Attends Religious Services:   . Active Member of Clubs or Organizations:   . Attends Banker Meetings:   Marland Kitchen Marital Status:     Family History  Problem Relation Age of Onset  . Alcoholism Maternal Grandmother   . Leukemia Maternal Grandmother   . Esophageal cancer Maternal Grandfather   . Lung cancer Maternal Grandfather     Health Maintenance  Topic Date Due  . HIV Screening  Never done  . INFLUENZA VACCINE  11/09/2019  . TETANUS/TDAP  07/18/2027     ----------------------------------------------------------------------------------------------------------------------------------------------------------------------------------------------------------------- Physical Exam BP 135/63 (BP Location: Left Arm, Patient Position: Sitting, Cuff Size: Large)   Pulse 71   Ht 5' 11.65" (1.82 m)   Wt 201 lb 12.8 oz (91.5 kg)   SpO2 99%   BMI 27.63 kg/m   Physical Exam Constitutional:      Appearance: Normal appearance.  Eyes:     General: No scleral icterus. Musculoskeletal:     Comments: Bruising with tenderness along R mid posterior thigh.  Mild weakness and pain with hamstring testing, worse medially.   Neurological:     General: No focal deficit present.     Mental Status: He is alert.  Psychiatric:  Mood and Affect: Mood normal.     ------------------------------------------------------------------------------------------------------------------------------------------------------------------------------------------------------------------- Assessment and Plan  Hamstring strain Recommend using compression, rest and icing. Recommend compression sleeve for thigh.  He may use OTC anti inflammatory as needed.  Note provided from work.  Discussed following proper lifting technique.  Call if having any new or worsening pain.    No orders of the defined types were placed in this encounter.   No follow-ups on file.    This visit occurred during the  SARS-CoV-2 public health emergency.  Safety protocols were in place, including screening questions prior to the visit, additional usage of staff PPE, and extensive cleaning of exam room while observing appropriate contact time as indicated for disinfecting solutions.

## 2019-08-21 NOTE — Patient Instructions (Signed)
Preventive Care 19-30 Years Old, Male Preventive care refers to lifestyle choices and visits with your health care provider that can promote health and wellness. This includes:  A yearly physical exam. This is also called an annual well check.  Regular dental and eye exams.  Immunizations.  Screening for certain conditions.  Healthy lifestyle choices, such as eating a healthy diet, getting regular exercise, not using drugs or products that contain nicotine and tobacco, and limiting alcohol use. What can I expect for my preventive care visit? Physical exam Your health care provider will check:  Height and weight. These may be used to calculate body mass index (BMI), which is a measurement that tells if you are at a healthy weight.  Heart rate and blood pressure.  Your skin for abnormal spots. Counseling Your health care provider may ask you questions about:  Alcohol, tobacco, and drug use.  Emotional well-being.  Home and relationship well-being.  Sexual activity.  Eating habits.  Work and work Statistician. What immunizations do I need?  Influenza (flu) vaccine  This is recommended every year. Tetanus, diphtheria, and pertussis (Tdap) vaccine  You may need a Td booster every 10 years. Varicella (chickenpox) vaccine  You may need this vaccine if you have not already been vaccinated. Human papillomavirus (HPV) vaccine  If recommended by your health care provider, you may need three doses over 6 months. Measles, mumps, and rubella (MMR) vaccine  You may need at least one dose of MMR. You may also need a second dose. Meningococcal conjugate (MenACWY) vaccine  One dose is recommended if you are 45-76 years old and a Market researcher living in a residence hall, or if you have one of several medical conditions. You may also need additional booster doses. Pneumococcal conjugate (PCV13) vaccine  You may need this if you have certain conditions and were not  previously vaccinated. Pneumococcal polysaccharide (PPSV23) vaccine  You may need one or two doses if you smoke cigarettes or if you have certain conditions. Hepatitis A vaccine  You may need this if you have certain conditions or if you travel or work in places where you may be exposed to hepatitis A. Hepatitis B vaccine  You may need this if you have certain conditions or if you travel or work in places where you may be exposed to hepatitis B. Haemophilus influenzae type b (Hib) vaccine  You may need this if you have certain risk factors. You may receive vaccines as individual doses or as more than one vaccine together in one shot (combination vaccines). Talk with your health care provider about the risks and benefits of combination vaccines. What tests do I need? Blood tests  Lipid and cholesterol levels. These may be checked every 5 years starting at age 17.  Hepatitis C test.  Hepatitis B test. Screening   Diabetes screening. This is done by checking your blood sugar (glucose) after you have not eaten for a while (fasting).  Sexually transmitted disease (STD) testing. Talk with your health care provider about your test results, treatment options, and if necessary, the need for more tests. Follow these instructions at home: Eating and drinking   Eat a diet that includes fresh fruits and vegetables, whole grains, lean protein, and low-fat dairy products.  Take vitamin and mineral supplements as recommended by your health care provider.  Do not drink alcohol if your health care provider tells you not to drink.  If you drink alcohol: ? Limit how much you have to 0-2  drinks a day. ? Be aware of how much alcohol is in your drink. In the [place] one drink equals one 12 oz bottle of beer (355 mL), one 5 oz glass of wine (148 mL), or one 1 oz glass of hard liquor (44 mL). Lifestyle  Take daily care of your teeth and gums.  Stay active. Exercise for at least 30 minutes on 5 or  more days each week.  Do not use any products that contain nicotine or tobacco, such as cigarettes, e-cigarettes, and chewing tobacco. If you need help quitting, ask your health care provider.  If you are sexually active, practice safe sex. Use a condom or other form of protection to prevent STIs (sexually transmitted infections). What's next?  Go to your health care provider once a year for a well check visit.  Ask your health care provider how often you should have your eyes and teeth checked.  Stay up to date on all vaccines. This information is not intended to replace advice given to you by your health care provider. Make sure you discuss any questions you have with your health care provider. Document Revised: 03/21/2018 Document Reviewed: 03/21/2018 Elsevier Patient Education  2020 [employer].

## 2019-08-21 NOTE — Patient Instructions (Signed)
Continue icing and ibuprofen/aleve.  Thigh sleeve may be helpful Limit lifting heavy objects for the next few days.     Hamstring Strain  A hamstring strain happens when the muscles in the back of the thighs (hamstring muscles) are overstretched or torn. The hamstring muscles are used in straightening the hips, bending the knees, and pulling back the legs. This injury is often called a pulled hamstring muscle. The tissue that connects the muscle to a bone (tendon) may also be affected. The severity of a hamstring strain may be rated in degrees or grades. First-degree (or grade 1) strains have the least amount of muscle tearing and pain. Second-degree and third-degree (grade 2 and 3) strains have increasingly more tearing and pain. What are the causes? This condition is caused by a sudden, violent force being placed on the hamstring muscles, stretching them too far. This often happens during activities that involve running, jumping, kicking, or weight lifting. What increases the risk? Hamstring strains are especially common in athletes. The following factors may also make you more likely to develop this condition:  Having low strength, endurance, or flexibility of the hamstring muscles.  Doing high-impact physical activity or sports.  Having poor physical fitness.  Having a previous leg injury.  Having tired (fatigued) muscles. What are the signs or symptoms? Symptoms of this condition include:  Pain in the back of the thigh.  Swelling.  Bruising.  Muscle spasms.  Trouble moving the affected muscle because of pain. For severe strains, you may feel popping or snapping in the back of your thigh when the injury occurs. How is this diagnosed? This condition is diagnosed based on your symptoms, your medical history, and a physical exam. How is this treated? Treatment for this condition usually involves:  Protecting, resting, icing, applying compression, and elevating the injured  area (PRICE therapy).  Medicines. Your health care provider may recommend medicines to help reduce pain or inflammation.  Doing exercises to regain strength and flexibility in the muscles. Your health care provider will tell you when it is okay to begin exercising. Follow these instructions at home: PRICE therapy Use PRICE therapy to promote muscle healing during the first 2-3 days after your injury, or as told by your health care provider.  Protect the muscle from being injured again.  Rest your injury. This usually involves limiting your normal activities and not using the injured hamstring muscle. Talk with your health care provider about how you should limit your activities.  Apply ice to the injured area: ? Put ice in a plastic bag. ? Place a towel between your skin and the bag. ? Leave the ice on for 20 minutes, 2-3 times a day. After the third day, switch to applying heat as told.  Put pressure (compression) on your injured hamstring by wrapping it with an elastic bandage. Be careful not to wrap it too tightly. That may interfere with blood circulation or may increase swelling.  Raise (elevate) your injured hamstring above the level of your heart as often as possible. When you are lying down, you can do this by putting a pillow under your thigh.  Activity  Begin exercising or stretching only as told by your health care provider.  Do not return to full activity level until your health care provider approves.  To help prevent muscle strains in the future, always warm up before exercising and stretch afterward. General instructions  Take over-the-counter and prescription medicines only as told by your health care provider.  If directed, apply heat to the affected area as often as told by your health care provider. Use the heat source that your health care provider recommends, such as a moist heat pack or a heating pad. ? Place a towel between your skin and the heat  source. ? Leave the heat on for 20-30 minutes. ? Remove the heat if your skin turns bright red. This is especially important if you are unable to feel pain, heat, or cold. You may have a greater risk of getting burned.  Keep all follow-up visits as told by your health care provider. This is important. Contact a health care provider if you have:  Increasing pain or swelling in the injured area.  Numbness, tingling, or a significant loss of strength in the injured area. Get help right away if:  Your foot or your toes become cold or turn blue. Summary  A hamstring strain happens when the muscles in the back of the thighs (hamstring muscles) are overstretched or torn.  This injury can be caused by a sudden, violent force being placed on the hamstring muscles, causing them to stretch too far.  Symptoms include pain, swelling, and muscle spasms in the injured area.  Treatment includes what is called PRICE therapy: protecting, resting, icing, applying compression, and elevating the injured area. This information is not intended to replace advice given to you by your health care provider. Make sure you discuss any questions you have with your health care provider. Document Revised: 03/09/2017 Document Reviewed: 02/22/2017 Elsevier Patient Education  2020 [employer].

## 2019-08-21 NOTE — Assessment & Plan Note (Signed)
Recommend using compression, rest and icing. Recommend compression sleeve for thigh.  He may use OTC anti inflammatory as needed.  Note provided from work.  Discussed following proper lifting technique.  Call if having any new or worsening pain.

## 2019-08-22 ENCOUNTER — Ambulatory Visit (INDEPENDENT_AMBULATORY_CARE_PROVIDER_SITE_OTHER): Payer: 59 | Admitting: Medical-Surgical

## 2019-08-22 ENCOUNTER — Encounter: Payer: Self-pay | Admitting: Medical-Surgical

## 2019-08-22 VITALS — BP 121/66 | HR 57 | Temp 97.9°F | Ht 72.0 in | Wt 200.8 lb

## 2019-08-22 DIAGNOSIS — Z0000 Encounter for general adult medical examination without abnormal findings: Secondary | ICD-10-CM

## 2019-08-22 DIAGNOSIS — Z114 Encounter for screening for human immunodeficiency virus [HIV]: Secondary | ICD-10-CM

## 2019-08-22 DIAGNOSIS — Z1329 Encounter for screening for other suspected endocrine disorder: Secondary | ICD-10-CM

## 2019-08-22 DIAGNOSIS — F411 Generalized anxiety disorder: Secondary | ICD-10-CM

## 2019-08-23 LAB — COMPLETE METABOLIC PANEL WITH GFR
AG Ratio: 2.7 (calc) — ABNORMAL HIGH (ref 1.0–2.5)
ALT: 36 U/L (ref 9–46)
AST: 26 U/L (ref 10–40)
Albumin: 4.8 g/dL (ref 3.6–5.1)
Alkaline phosphatase (APISO): 50 U/L (ref 36–130)
BUN: 15 mg/dL (ref 7–25)
CO2: 29 mmol/L (ref 20–32)
Calcium: 9.6 mg/dL (ref 8.6–10.3)
Chloride: 105 mmol/L (ref 98–110)
Creat: 0.96 mg/dL (ref 0.60–1.35)
GFR, Est African American: 123 mL/min/{1.73_m2} (ref >=60)
GFR, Est Non African American: 106 mL/min/{1.73_m2} (ref >=60)
Globulin: 1.8 g/dL (calc) — ABNORMAL LOW (ref 1.9–3.7)
Glucose, Bld: 90 mg/dL (ref 65–99)
Potassium: 4.6 mmol/L (ref 3.5–5.3)
Sodium: 139 mmol/L (ref 135–146)
Total Bilirubin: 1 mg/dL (ref 0.2–1.2)
Total Protein: 6.6 g/dL (ref 6.1–8.1)

## 2019-08-23 LAB — LIPID PANEL
Cholesterol: 113 mg/dL (ref <200)
HDL: 48 mg/dL (ref >=40)
LDL Cholesterol (Calc): 51 mg/dL (calc)
Non-HDL Cholesterol (Calc): 65 mg/dL (calc) (ref <130)
Total CHOL/HDL Ratio: 2.4 (calc) (ref <5.0)
Triglycerides: 59 mg/dL (ref <150)

## 2019-08-23 LAB — CBC
HCT: 44.7 % (ref 38.5–50.0)
Hemoglobin: 15.4 g/dL (ref 13.2–17.1)
MCH: 30.6 pg (ref 27.0–33.0)
MCHC: 34.5 g/dL (ref 32.0–36.0)
MCV: 88.7 fL (ref 80.0–100.0)
MPV: 9.9 fL (ref 7.5–12.5)
Platelets: 187 10*3/uL (ref 140–400)
RBC: 5.04 10*6/uL (ref 4.20–5.80)
RDW: 12.3 % (ref 11.0–15.0)
WBC: 4.6 10*3/uL (ref 3.8–10.8)

## 2019-08-23 LAB — HIV ANTIBODY (ROUTINE TESTING W REFLEX): HIV 1&2 Ab, 4th Generation: NONREACTIVE

## 2019-08-23 LAB — TSH: TSH: 1.12 mIU/L (ref 0.40–4.50)

## 2019-11-19 IMAGING — DX DG KNEE COMPLETE 4+V*R*
4 series · 4 of 4 positions shown · non-contrast
Comparison: None.

CLINICAL DATA: Right knee pain after skateboarding injury
yesterday.

EXAM:
RIGHT KNEE - COMPLETE 4+ VIEW

[knee ap]
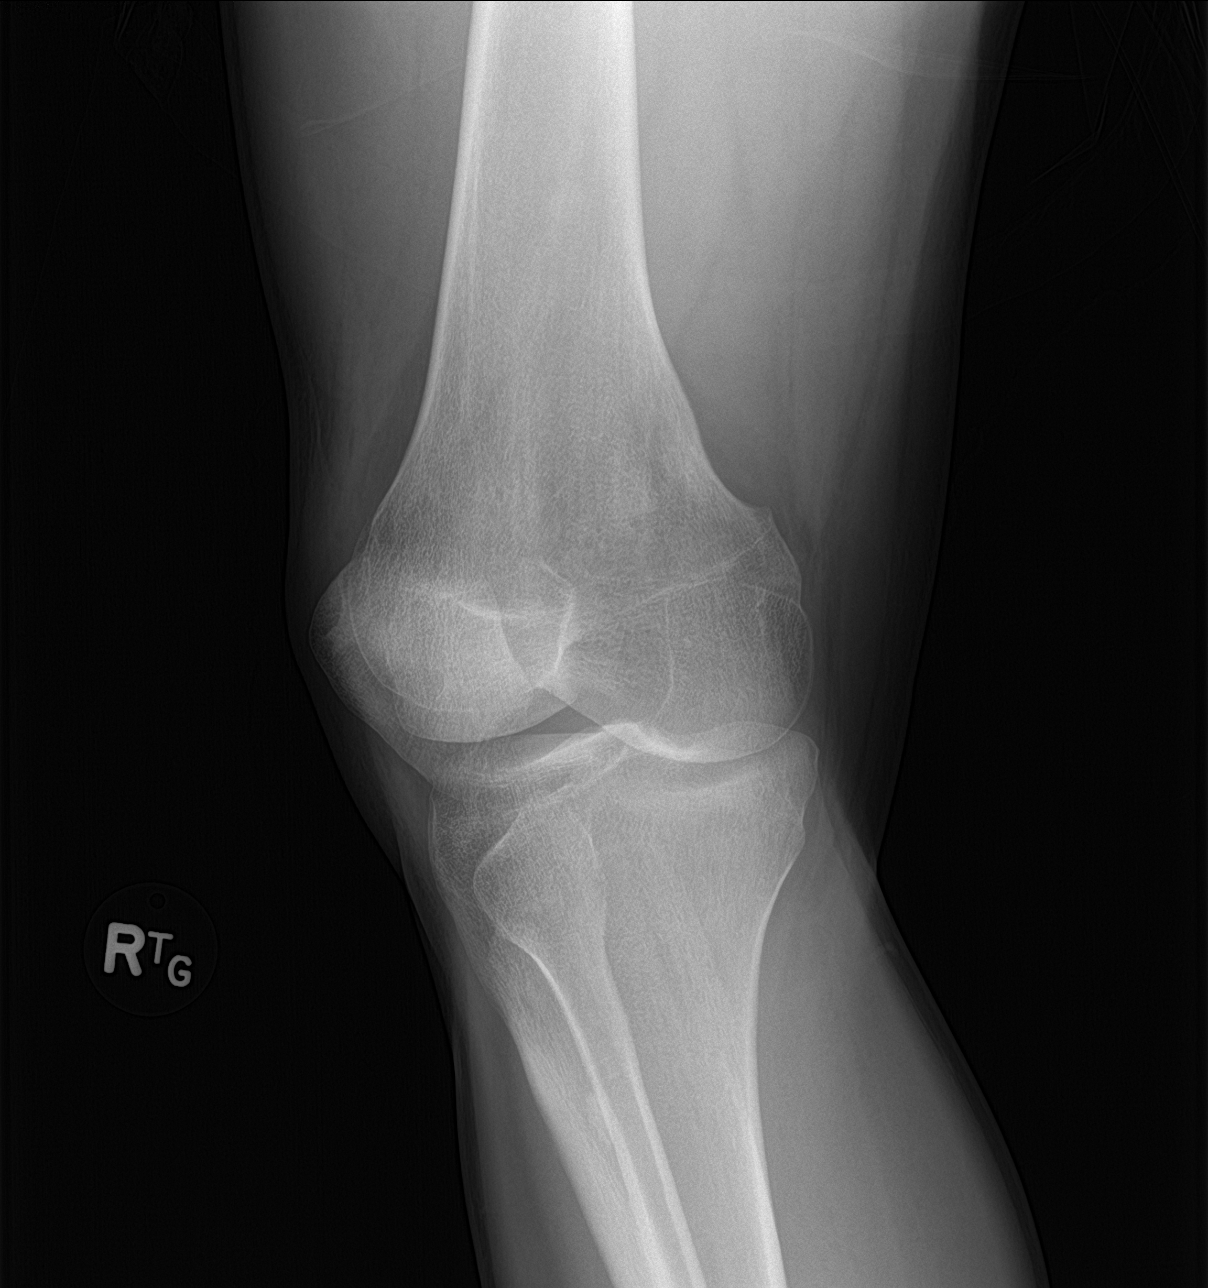

[knee lat]
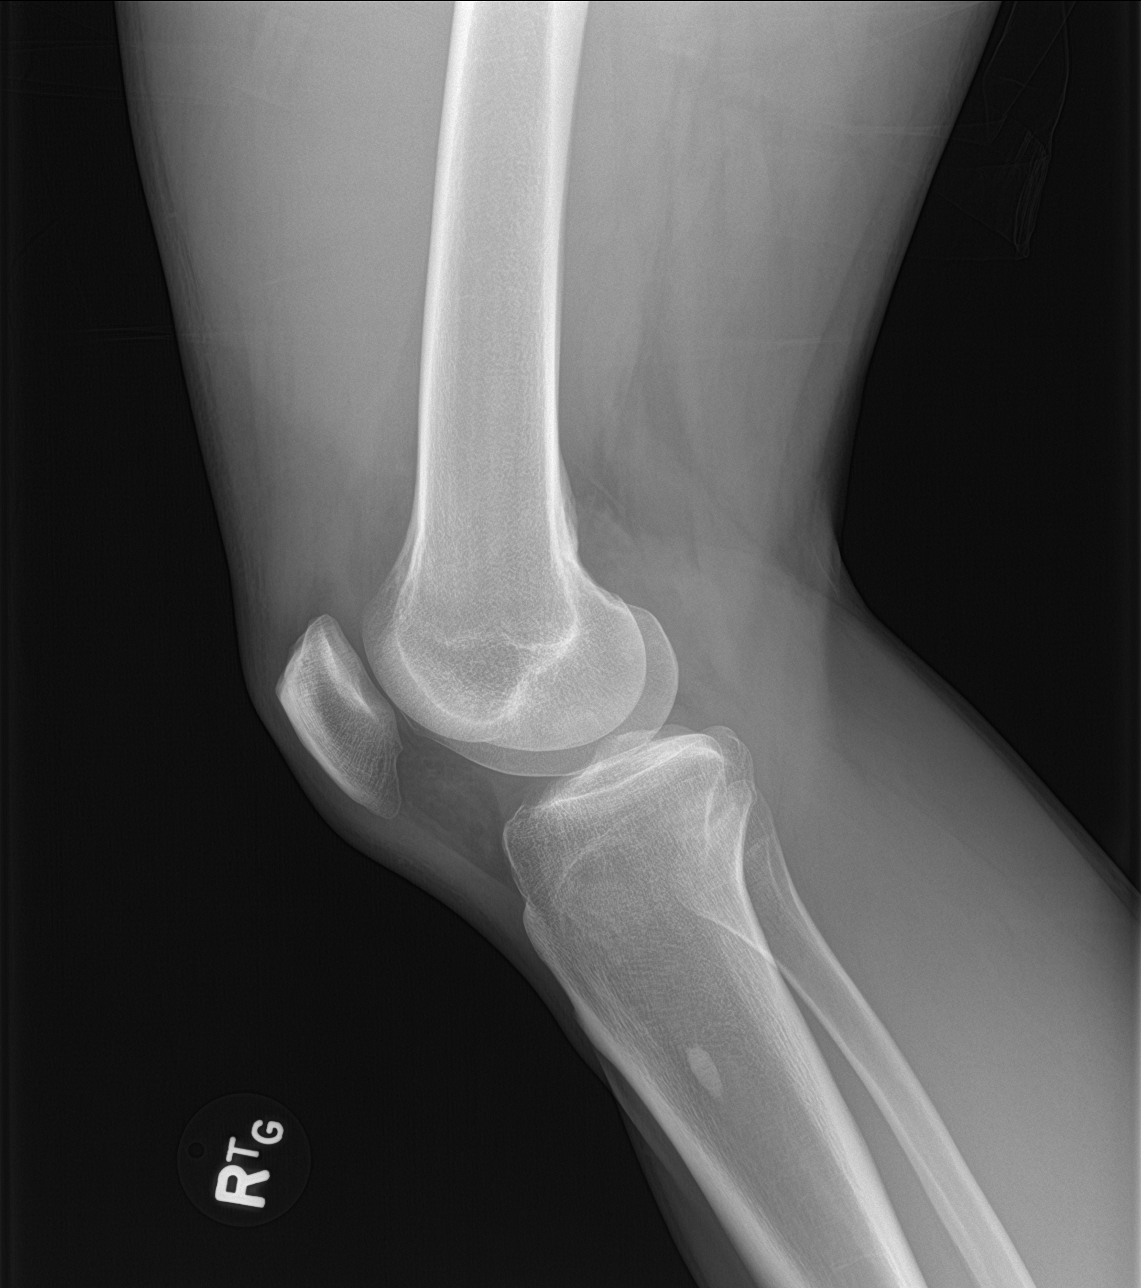

[knee obl (1 of 2)]
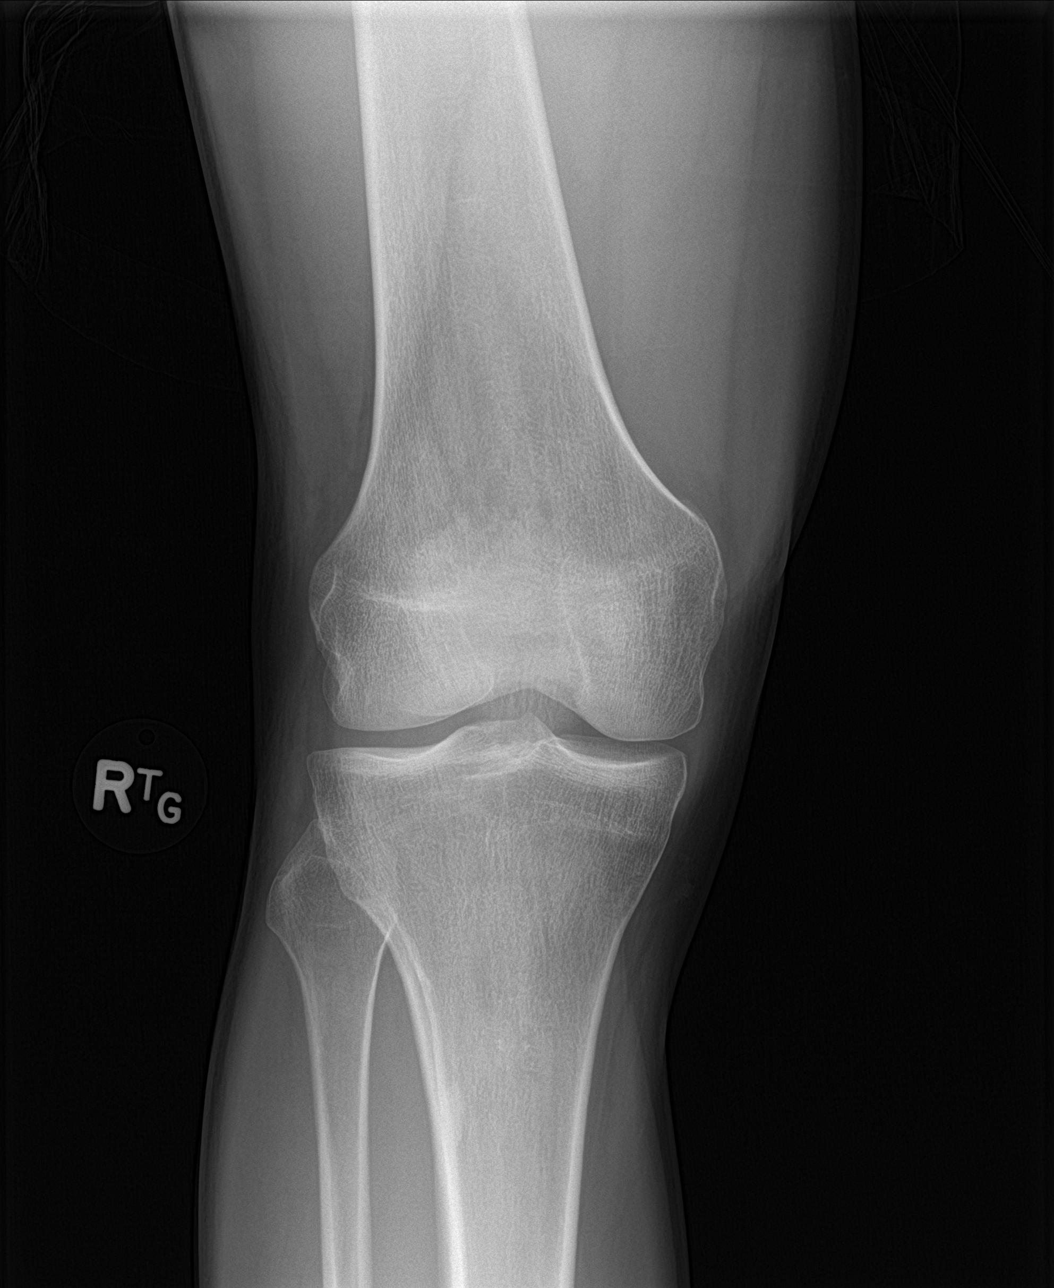

[knee obl (2 of 2)]
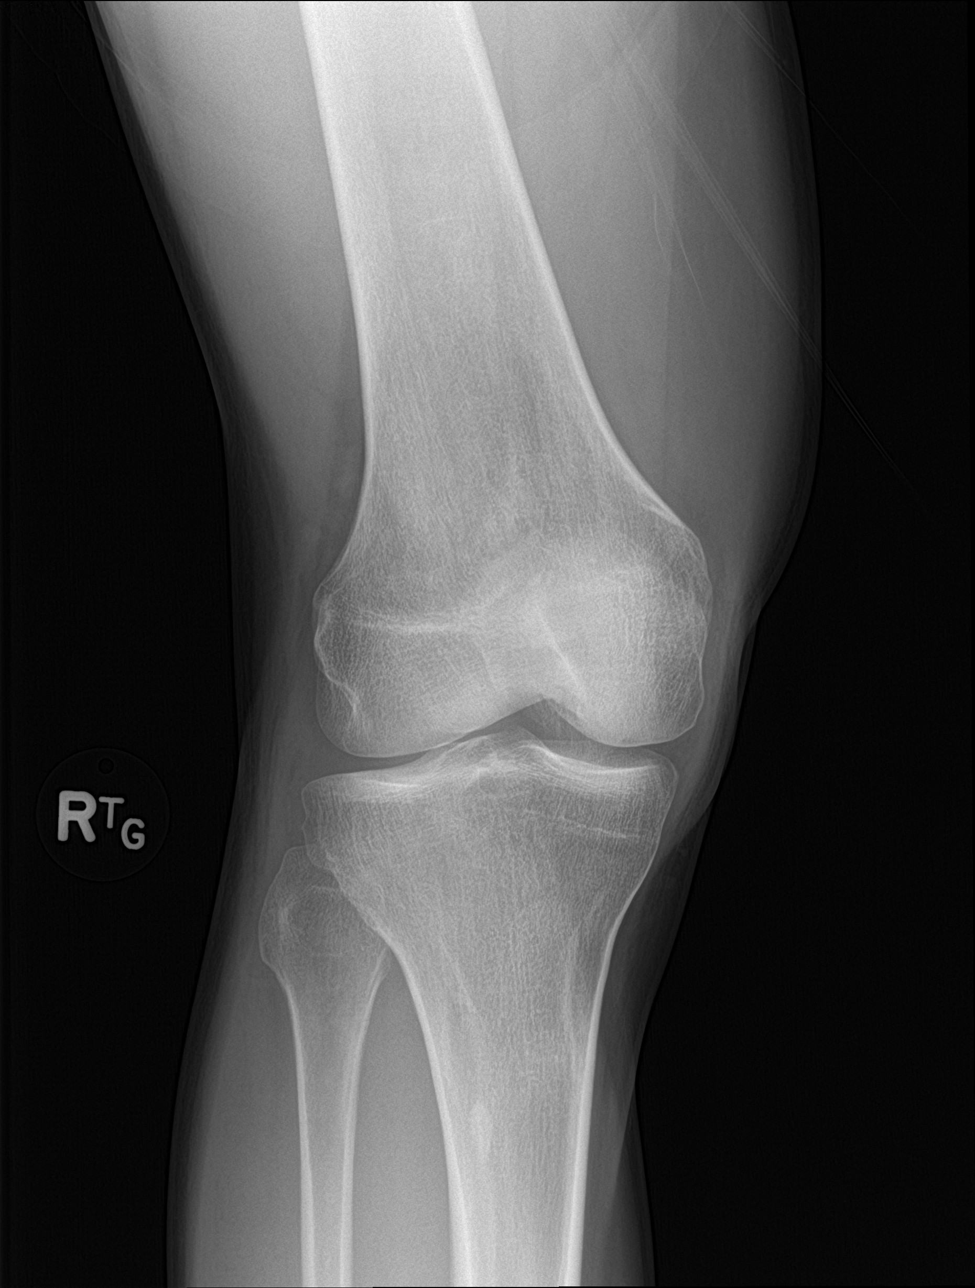

[4 of 4 positions shown; findings below may reference images not displayed]

FINDINGS: No evidence of fracture, dislocation, or joint effusion. No evidence
of arthropathy or other focal bone abnormality. Soft tissues are
unremarkable.
IMPRESSION: Negative.

## 2020-02-23 ENCOUNTER — Telehealth: Payer: Self-pay | Admitting: Osteopathic Medicine

## 2020-02-23 NOTE — Telephone Encounter (Signed)
Pt dropped off paperwork from their employer for a physical screening. Dropped off in [place]'s box.

## 2020-02-26 NOTE — Telephone Encounter (Signed)
Completed, sent to fax

## 2020-02-27 NOTE — Telephone Encounter (Signed)
Task completed. Form faxed to [employer] at [phone] on 02/26/2020. Confirmation was rec'd. Placed in scan folder.

## 2020-09-14 ENCOUNTER — Ambulatory Visit: Payer: 59 | Admitting: Osteopathic Medicine

## 2020-10-02 ENCOUNTER — Encounter: Payer: Self-pay | Admitting: Osteopathic Medicine

## 2020-10-07 ENCOUNTER — Ambulatory Visit (INDEPENDENT_AMBULATORY_CARE_PROVIDER_SITE_OTHER): Payer: 59 | Admitting: Osteopathic Medicine

## 2020-10-07 ENCOUNTER — Other Ambulatory Visit: Payer: Self-pay

## 2020-10-07 ENCOUNTER — Encounter: Payer: Self-pay | Admitting: Osteopathic Medicine

## 2020-10-07 VITALS — BP 122/64 | HR 67 | Temp 97.7°F | Wt 210.0 lb

## 2020-10-07 DIAGNOSIS — F411 Generalized anxiety disorder: Secondary | ICD-10-CM

## 2020-10-07 DIAGNOSIS — Z0000 Encounter for general adult medical examination without abnormal findings: Secondary | ICD-10-CM | POA: Diagnosis not present

## 2020-10-07 MED ORDER — DIAZEPAM 2 MG PO TABS: 2.0000 mg | ORAL_TABLET | Freq: Two times a day (BID) | ORAL | 0 refills | Status: DC | PRN

## 2020-10-07 MED ORDER — ESCITALOPRAM OXALATE 10 MG PO TABS: 10.0000 mg | ORAL_TABLET | Freq: Every day | ORAL | 1 refills | Status: DC

## 2020-10-07 NOTE — Patient Instructions (Signed)
Restarting Lexapro 10 mg. Switch Klonopin to Valium. OK to use Valium twice daily every day for the next month if needed, try to use less if you don't need it. Will transition to less frequent use of this after a month or two.   For immediate mental health services open 24 hours and no referral needed: [facility], [address] [place], [place] [address], [phone] [facility], [address], [place], [place] [address], [phone] [facility], [address] [place], [place] [address], [phone] Any emergency room or 911  [facility] [place], [phone]

## 2020-10-07 NOTE — Progress Notes (Signed)
Jesse Hoover is a 30 y.o. male who presents to  [place] at [facility]  today, 10/07/20, seeking care for the following:  Anxiety and depression - would like to get back on medications.  Hair loss - would like to restart treatment for this      ASSESSMENT & PLAN with other pertinent findings:  The primary encounter diagnosis was Generalized anxiety disorder. A diagnosis of Annual physical exam was also pertinent to this visit.    Patient Instructions  Restarting Lexapro 10 mg. Switch Klonopin to Valium. OK to use Valium twice daily every day for the next month if needed, try to use less if you don't need it. Will transition to less frequent use of this after a month or two.   For immediate mental health services open 24 hours and no referral needed: [facility], [address] [place], [place] [address], [place], [address], [place], [place] [address], [facility], [address], [place], [place] [address], [phone] Any emergency room or [place], [phone]  Orders Placed This Encounter  Procedures   CBC   COMPLETE METABOLIC PANEL WITH GFR   Lipid panel   TSH    Meds ordered this encounter  Medications   diazepam (VALIUM) 2 MG tablet    Sig: Take 1 tablet (2 mg total) by mouth 2 (two) times daily as needed for anxiety.    Dispense:  60 tablet    Refill:  0   escitalopram (LEXAPRO) 10 MG tablet    Sig: Take 1 tablet (10 mg total) by mouth daily.    Dispense:  90 tablet    Refill:  1   minoxidil (ROGAINE) 2 % external solution    Sig: Apply topically 2 (two) times daily.    Dispense:  180 mL    Refill:  1     See below for relevant physical exam findings  See below for recent lab and imaging results reviewed  Medications, allergies, PMH, PSH, SocH, [place] reviewed  below    Follow-up instructions: Return in about 4 weeks (around 11/04/2020) for ANNUAL PHYSICAL AND MENTAL HEALTH RECHECK - SEE [place] SOONER IF NEEDED.                                        Exam:  BP 122/64 (BP Location: Left Arm, Patient Position: Sitting, Cuff Size: Normal)   Pulse 67   Temp 97.7 F (36.5 C) (Oral)   Wt 210 lb 0.6 oz (95.3 kg)   BMI 28.49 kg/m  Constitutional: VS see above. General Appearance: alert, well-developed, well-nourished, NAD Neck: No masses, trachea midline.  Respiratory: Normal respiratory effort. Neurological: Normal balance/coordination. No tremor. Skin: warm, dry, intact.  Psychiatric: Normal judgment/insight. Flat mood and affect. Oriented x3.   Current Meds  Medication Sig   diazepam (VALIUM) 2 MG tablet Take 1 tablet (2 mg total) by mouth 2 (two) times daily as needed for anxiety.   minoxidil (ROGAINE) 2 % external solution Apply topically 2 (two) times daily.    Allergies  Allergen Reactions   Bee Pollen Swelling    Patient Active Problem List   Diagnosis Date Noted   Hamstring strain 08/21/2019   Generalized anxiety disorder 06/19/2017    Family History  Problem Relation Age of Onset  Alcoholism Maternal Grandmother    Leukemia Maternal Grandmother    Esophageal cancer Maternal Grandfather    Lung cancer Maternal Grandfather    Multiple myeloma Mother     Social History   Tobacco Use  Smoking Status Never  Smokeless Tobacco Never    Past Surgical History:  Procedure Laterality Date   NO PAST SURGERIES      Immunization History  Administered Date(s) Administered   Influenza,inj,Quad PF,6+ Mos 01/24/2018   Influenza-Unspecified 02/08/2017, 02/08/2019   Moderna Sars-Covid-2 Vaccination 06/29/2019, 07/27/2019   Tdap 07/17/2017    Recent Results (from the past 2160 hour(s))  CBC     Status: None   Collection Time: 10/07/20 12:00 AM  Result Value Ref Range   WBC 5.5 3.8 - 10.8  Thousand/uL   RBC 5.08 4.20 - 5.80 Million/uL   Hemoglobin 15.3 13.2 - 17.1 g/dL   HCT 45.5 38.5 - 50.0 %   MCV 89.6 80.0 - 100.0 fL   MCH 30.1 27.0 - 33.0 pg   MCHC 33.6 32.0 - 36.0 g/dL   RDW 11.9 11.0 - 15.0 %   Platelets 205 140 - 400 Thousand/uL   MPV 10.1 7.5 - 12.5 fL  COMPLETE METABOLIC PANEL WITH GFR     Status: None   Collection Time: 10/07/20 12:00 AM  Result Value Ref Range   Glucose, Bld 90 65 - 99 mg/dL    Comment: .            Fasting reference interval .    BUN 21 7 - 25 mg/dL   Creat 1.02 0.60 - 1.35 mg/dL   GFR, Est Non African American 98 > OR = 60 mL/min/1.75m   GFR, Est African American 114 > OR = 60 mL/min/1.748m  BUN/Creatinine Ratio NOT APPLICABLE 6 - 22 (calc)   Sodium 139 135 - 146 mmol/L   Potassium 4.3 3.5 - 5.3 mmol/L   Chloride 104 98 - 110 mmol/L   CO2 27 20 - 32 mmol/L   Calcium 9.8 8.6 - 10.3 mg/dL   Total Protein 6.8 6.1 - 8.1 g/dL   Albumin 4.6 3.6 - 5.1 g/dL   Globulin 2.2 1.9 - 3.7 g/dL (calc)   AG Ratio 2.1 1.0 - 2.5 (calc)   Total Bilirubin 0.5 0.2 - 1.2 mg/dL   Alkaline phosphatase (APISO) 51 36 - 130 U/L   AST 21 10 - 40 U/L   ALT 28 9 - 46 U/L  Lipid panel     Status: None   Collection Time: 10/07/20 12:00 AM  Result Value Ref Range   Cholesterol 132 <200 mg/dL   HDL 42 > OR = 40 mg/dL   Triglycerides 77 <150 mg/dL   LDL Cholesterol (Calc) 74 mg/dL (calc)    Comment: Reference range: <100 . Desirable range <100 mg/dL for primary prevention;   <70 mg/dL for patients with CHD or diabetic patients  with > or = 2 CHD risk factors. . Marland KitchenDL-C is now calculated using the Martin-Hopkins  calculation, which is a validated novel method providing  better accuracy than the Friedewald equation in the  estimation of LDL-C.  MaCresenciano Genret al. JAAnnamaria [phone]  (http://education.[facility].com/faq/FAQ164)    Total CHOL/HDL Ratio 3.1 <5.0 (calc)   Non-HDL Cholesterol (Calc) 90 <130 mg/dL (calc)    Comment: For  patients with diabetes plus 1 major ASCVD risk  factor, treating to a non-HDL-C goal of <100 mg/dL  (LDL-C of <70 mg/dL) is considered a therapeutic  option.  TSH     Status: None   Collection Time: 10/07/20 12:00 AM  Result Value Ref Range   TSH 1.77 0.40 - 4.50 mIU/L    No results found.     All questions at time of visit were answered - patient instructed to contact office with any additional concerns or updates. ER/RTC precautions were reviewed with the patient as applicable.   Please note: manual typing as well as voice recognition software may have been used to produce this document - typos may escape review. Please contact Dr. Sheppard Coil for any needed clarifications.   Total encounter time on date of service, 10/08/20, was 30 minutes spent addressing problems/issues as noted above in [place], including time spent in discussion with patient regarding the HPI, ROS, confirming history, reviewing Assessment & Plan, as well as time spent on coordination of care, record review.

## 2020-10-08 ENCOUNTER — Encounter: Payer: Self-pay | Admitting: Osteopathic Medicine

## 2020-10-08 DIAGNOSIS — L659 Nonscarring hair loss, unspecified: Secondary | ICD-10-CM

## 2020-10-08 LAB — CBC
HCT: 45.5 % (ref 38.5–50.0)
Hemoglobin: 15.3 g/dL (ref 13.2–17.1)
MCH: 30.1 pg (ref 27.0–33.0)
MCHC: 33.6 g/dL (ref 32.0–36.0)
MCV: 89.6 fL (ref 80.0–100.0)
MPV: 10.1 fL (ref 7.5–12.5)
Platelets: 205 10*3/uL (ref 140–400)
RBC: 5.08 10*6/uL (ref 4.20–5.80)
RDW: 11.9 % (ref 11.0–15.0)
WBC: 5.5 10*3/uL (ref 3.8–10.8)

## 2020-10-08 LAB — LIPID PANEL
Cholesterol: 132 mg/dL (ref <200)
HDL: 42 mg/dL (ref >=40)
LDL Cholesterol (Calc): 74 mg/dL (calc)
Non-HDL Cholesterol (Calc): 90 mg/dL (calc) (ref <130)
Total CHOL/HDL Ratio: 3.1 (calc) (ref <5.0)
Triglycerides: 77 mg/dL (ref <150)

## 2020-10-08 LAB — COMPLETE METABOLIC PANEL WITH GFR
AG Ratio: 2.1 (calc) (ref 1.0–2.5)
ALT: 28 U/L (ref 9–46)
AST: 21 U/L (ref 10–40)
Albumin: 4.6 g/dL (ref 3.6–5.1)
Alkaline phosphatase (APISO): 51 U/L (ref 36–130)
BUN: 21 mg/dL (ref 7–25)
CO2: 27 mmol/L (ref 20–32)
Calcium: 9.8 mg/dL (ref 8.6–10.3)
Chloride: 104 mmol/L (ref 98–110)
Creat: 1.02 mg/dL (ref 0.60–1.35)
GFR, Est African American: 114 mL/min/{1.73_m2} (ref >=60)
GFR, Est Non African American: 98 mL/min/{1.73_m2} (ref >=60)
Globulin: 2.2 g/dL (calc) (ref 1.9–3.7)
Glucose, Bld: 90 mg/dL (ref 65–99)
Potassium: 4.3 mmol/L (ref 3.5–5.3)
Sodium: 139 mmol/L (ref 135–146)
Total Bilirubin: 0.5 mg/dL (ref 0.2–1.2)
Total Protein: 6.8 g/dL (ref 6.1–8.1)

## 2020-10-08 LAB — TSH: TSH: 1.77 mIU/L (ref 0.40–4.50)

## 2020-10-08 MED ORDER — FINASTERIDE 5 MG PO TABS: 2.5000 mg | ORAL_TABLET | Freq: Every day | ORAL | 1 refills | Status: DC

## 2020-10-08 MED ORDER — ROGAINE 2 % EX SOLN: Freq: Two times a day (BID) | CUTANEOUS | 1 refills | Status: DC

## 2020-10-08 NOTE — Telephone Encounter (Signed)
Just seen yesterday for physical. [employer] pended.

## 2020-10-28 MED ORDER — MINOXIDIL 5 % EX FOAM: CUTANEOUS | 2 refills | Status: DC

## 2020-10-28 NOTE — Telephone Encounter (Signed)
Forwarding to covering provider.

## 2020-10-28 NOTE — Addendum Note (Signed)
Addended by: Gena Fray E on: 10/28/2020 12:27 PM   Modules accepted: Orders

## 2020-11-23 ENCOUNTER — Ambulatory Visit (INDEPENDENT_AMBULATORY_CARE_PROVIDER_SITE_OTHER): Payer: 59 | Admitting: Osteopathic Medicine

## 2020-11-23 ENCOUNTER — Encounter: Payer: Self-pay | Admitting: Osteopathic Medicine

## 2020-11-23 ENCOUNTER — Other Ambulatory Visit: Payer: Self-pay

## 2020-11-23 VITALS — BP 114/59 | HR 63 | Temp 97.7°F | Wt 213.0 lb

## 2020-11-23 DIAGNOSIS — F411 Generalized anxiety disorder: Secondary | ICD-10-CM | POA: Diagnosis not present

## 2020-11-23 DIAGNOSIS — Z0000 Encounter for general adult medical examination without abnormal findings: Secondary | ICD-10-CM | POA: Diagnosis not present

## 2020-11-23 MED ORDER — DIAZEPAM 2 MG PO TABS: 1.0000 mg | ORAL_TABLET | Freq: Two times a day (BID) | ORAL | 0 refills | Status: DC | PRN

## 2020-11-23 MED ORDER — ESCITALOPRAM OXALATE 20 MG PO TABS: 20.0000 mg | ORAL_TABLET | Freq: Every day | ORAL | 1 refills | Status: DC

## 2020-11-23 NOTE — Patient Instructions (Addendum)
Valium: ok to take twice per day as absolutely needed, but try NOT to take this medicine unless it's an "anxiety emergency"  Lexapro aka escitalopram; increasing to 20 mg, let's see fi this does a better job controlling / preventing anxiety in general   General Preventive Care Most recent routine screening labs: done recently and all good, repeat in 1-2 years.  Blood pressure goal 130/80 or less.  Tobacco: don't!  Alcohol: responsible moderation is ok for most adults - if you have concerns about your alcohol intake, please talk to me!  Exercise: as tolerated to reduce risk of cardiovascular disease and diabetes. Strength training will also prevent osteoporosis.  Mental health: if need for mental health care (adjust medicines, refer for counseling, other), please let me know!  Sexual / Reproductive health: if need for STD testing, or if concerns with libido/pain problems, please let me know! If you need to discuss family planning, please let me know!  Advanced Directive: Living Will and/or Healthcare Power of Attorney recommended for all adults, regardless of age or health.  Vaccines Flu vaccine: for almost everyone, every fall.  Shingles vaccine: after age 26.  Pneumonia vaccines: after age 101. Tetanus booster: every 10 years, due 2029 HPV vaccine: Gardasil up to age 76 COVID vaccine: THANKS for getting your vaccine! :) BOOSTER(S) STRONGLY RECOMMENDED 6 MOS AFTER FIRST SHOT, AND IN ANOTHER 6 MOS FOR CERTAIN HIGH-RISK POPULATIONS  Cancer screenings  Colon cancer screening: for everyone age 62 Prostate cancer screening: PSA blood test age 63 Lung cancer screening: not needed for non-smokers  Infection screenings  HIV: recommended screening at least once age 66-65, more often as needed. Gonorrhea/Chlamydia, other STI: screening as needed Hepatitis C: recommended once for everyone age 21-75 TB: certain at-risk populations, or depending on work requirements and/or travel history Other Bone  Density Test: recommended for men at age 31 Abdominal Aortic Aneurysm: screening with ultrasound recommended once for men age 40-75 who have ever smoked

## 2020-11-23 NOTE — Progress Notes (Signed)
Jesse Hoover is a 30 y.o. male who presents to  [place] at [facility]  today, 11/23/20, seeking care for the following:  Annual physical - see below  Mental health - still having some anxiety issues, needing the Valium most days bid and is low on this. Overall a lot better compared to last visit but still having some concerns about anxious moods.      ASSESSMENT & PLAN with other pertinent findings:  The primary encounter diagnosis was Annual physical exam. A diagnosis of Generalized anxiety disorder was also pertinent to this visit.    Patient Instructions  Valium: ok to take twice per day as absolutely needed, but try NOT to take this medicine unless it's an "anxiety emergency"  Lexapro aka escitalopram; increasing to 20 mg, let's see fi this does a better job controlling / preventing anxiety in general   General Preventive Care Most recent routine screening labs: done recently and all good, repeat in 1-2 years.  Blood pressure goal 130/80 or less.  Tobacco: don't!  Alcohol: responsible moderation is ok for most adults - if you have concerns about your alcohol intake, please talk to me!  Exercise: as tolerated to reduce risk of cardiovascular disease and diabetes. Strength training will also prevent osteoporosis.  Mental health: if need for mental health care (adjust medicines, refer for counseling, other), please let me know!  Sexual / Reproductive health: if need for STD testing, or if concerns with libido/pain problems, please let me know! If you need to discuss family planning, please let me know!  Advanced Directive: Living Will and/or Healthcare Power of Attorney recommended for all adults, regardless of age or health.  Vaccines Flu vaccine: for almost everyone, every fall.  Shingles vaccine: after age 90.  Pneumonia vaccines: after age 62. Tetanus booster: every 10 years, due 2029 HPV vaccine: Gardasil up to age 79 COVID  vaccine: THANKS for getting your vaccine! :) BOOSTER(S) STRONGLY RECOMMENDED 6 MOS AFTER FIRST SHOT, AND IN ANOTHER 6 MOS FOR CERTAIN HIGH-RISK POPULATIONS  Cancer screenings  Colon cancer screening: for everyone age 82 Prostate cancer screening: PSA blood test age 18 Lung cancer screening: not needed for non-smokers  Infection screenings  HIV: recommended screening at least once age 27-65, more often as needed. Gonorrhea/Chlamydia, other STI: screening as needed Hepatitis C: recommended once for everyone age 44-96 TB: certain at-risk populations, or depending on work requirements and/or travel history Other Bone Density Test: recommended for men at age 54 Abdominal Aortic Aneurysm: screening with ultrasound recommended once for men age 60-75 who have ever smoked   No orders of the defined types were placed in this encounter.   Meds ordered this encounter  Medications   escitalopram (LEXAPRO) 20 MG tablet    Sig: Take 1 tablet (20 mg total) by mouth daily.    Dispense:  90 tablet    Refill:  1   diazepam (VALIUM) 2 MG tablet    Sig: Take 0.5-1 tablets (1-2 mg total) by mouth 2 (two) times daily as needed for anxiety.    Dispense:  60 tablet    Refill:  0     See below for relevant physical exam findings  See below for recent lab and imaging results reviewed  Medications, allergies, PMH, PSH, SocH, FamH reviewed below    Follow-up instructions: Return in about 4 weeks (around 12/21/2020) for [place], VIRTUAL *OR* IN-OFFICE VISIT.  Exam:  BP (!) 114/59 (BP Location: Left Arm, Patient Position: Sitting, Cuff Size: Large)   Pulse 63   Temp 97.7 F (36.5 C) (Oral)   Wt 213 lb (96.6 kg)   BMI 28.89 kg/m  Constitutional: VS see above. General Appearance: alert, well-developed, well-nourished, NAD Neck: No masses, trachea midline.  Respiratory: Normal respiratory  effort. no wheeze, no rhonchi, no rales Cardiovascular: S1/S2 normal, no murmur, no rub/gallop auscultated. RRR.  Musculoskeletal: Gait normal. Symmetric and independent movement of all extremities Abdominal: non-tender, non-distended, no appreciable organomegaly, neg Murphy's, BS WNLx4 Neurological: Normal balance/coordination. No tremor. Skin: warm, dry, intact.  Psychiatric: Normal judgment/insight. Normal mood and affect. Oriented x3.   Current Meds  Medication Sig   finasteride (PROSCAR) 5 MG tablet Take 0.5-1 tablets (2.5-5 mg total) by mouth daily.   Minoxidil 5 % FOAM Apply 1/2 capful topically twice daily   [DISCONTINUED] diazepam (VALIUM) 2 MG tablet Take 1 tablet (2 mg total) by mouth 2 (two) times daily as needed for anxiety.   [DISCONTINUED] escitalopram (LEXAPRO) 10 MG tablet Take 1 tablet (10 mg total) by mouth daily.    Allergies  Allergen Reactions   Bee Pollen Swelling    Patient Active Problem List   Diagnosis Date Noted   Hamstring strain 08/21/2019   Generalized anxiety disorder 06/19/2017    Family History  Problem Relation Age of Onset   Alcoholism Maternal Grandmother    Leukemia Maternal Grandmother    Esophageal cancer Maternal Grandfather    Lung cancer Maternal Grandfather    Multiple myeloma Mother     Social History   Tobacco Use  Smoking Status Never  Smokeless Tobacco Never    Past Surgical History:  Procedure Laterality Date   NO PAST SURGERIES      Immunization History  Administered Date(s) Administered   Influenza,inj,Quad PF,6+ Mos 01/24/2018   Influenza-Unspecified 02/08/2017, 02/08/2019   Moderna Sars-Covid-2 Vaccination 06/29/2019, 07/27/2019   Tdap 07/17/2017    Recent Results (from the past 2160 hour(s))  CBC     Status: None   Collection Time: 10/07/20 12:00 AM  Result Value Ref Range   WBC 5.5 3.8 - 10.8 Thousand/uL   RBC 5.08 4.20 - 5.80 Million/uL   Hemoglobin 15.3 13.2 - 17.1 g/dL   HCT 45.5 38.5 - 50.0 %    MCV 89.6 80.0 - 100.0 fL   MCH 30.1 27.0 - 33.0 pg   MCHC 33.6 32.0 - 36.0 g/dL   RDW 11.9 11.0 - 15.0 %   Platelets 205 140 - 400 Thousand/uL   MPV 10.1 7.5 - 12.5 fL  COMPLETE METABOLIC PANEL WITH GFR     Status: None   Collection Time: 10/07/20 12:00 AM  Result Value Ref Range   Glucose, Bld 90 65 - 99 mg/dL    Comment: .            Fasting reference interval .    BUN 21 7 - 25 mg/dL   Creat 1.02 0.60 - 1.35 mg/dL   GFR, Est Non African American 98 > OR = 60 mL/min/1.3m   GFR, Est African American 114 > OR = 60 mL/min/1.720m  BUN/Creatinine Ratio NOT APPLICABLE 6 - 22 (calc)   Sodium 139 135 - 146 mmol/L   Potassium 4.3 3.5 - 5.3 mmol/L   Chloride 104 98 - 110 mmol/L   CO2 27 20 - 32 mmol/L   Calcium 9.8 8.6 - 10.3 mg/dL   Total Protein 6.8 6.1 - 8.1 g/dL  Albumin 4.6 3.6 - 5.1 g/dL   Globulin 2.2 1.9 - 3.7 g/dL (calc)   AG Ratio 2.1 1.0 - 2.5 (calc)   Total Bilirubin 0.5 0.2 - 1.2 mg/dL   Alkaline phosphatase (APISO) 51 36 - 130 U/L   AST 21 10 - 40 U/L   ALT 28 9 - 46 U/L  Lipid panel     Status: None   Collection Time: 10/07/20 12:00 AM  Result Value Ref Range   Cholesterol 132 <200 mg/dL   HDL 42 > OR = 40 mg/dL   Triglycerides 77 <150 mg/dL   LDL Cholesterol (Calc) 74 mg/dL (calc)    Comment: Reference range: <100 . Desirable range <100 mg/dL for primary prevention;   <70 mg/dL for patients with CHD or diabetic patients  with > or = 2 CHD risk factors. Marland Kitchen LDL-C is now calculated using the Martin-Hopkins  calculation, which is a validated novel method providing  better accuracy than the Friedewald equation in the  estimation of LDL-C.  Cresenciano Genre et al. Annamaria Helling. 4600;298(47): 2061-2068  (http://education.[employer].com/faq/FAQ164)    Total CHOL/HDL Ratio 3.1 <5.0 (calc)   Non-HDL Cholesterol (Calc) 90 <130 mg/dL (calc)    Comment: For patients with diabetes plus 1 major ASCVD risk  factor, treating to a non-HDL-C goal of <100 mg/dL  (LDL-C of <70  mg/dL) is considered a therapeutic  option.   TSH     Status: None   Collection Time: 10/07/20 12:00 AM  Result Value Ref Range   TSH 1.77 0.40 - 4.50 mIU/L    No results found.     All questions at time of visit were answered - patient instructed to contact office with any additional concerns or updates. ER/RTC precautions were reviewed with the patient as applicable.   Please note: manual typing as well as voice recognition software may have been used to produce this document - typos may escape review. Please contact Dr. Sheppard Coil for any needed clarifications.

## 2021-01-21 ENCOUNTER — Other Ambulatory Visit: Payer: Self-pay | Admitting: Osteopathic Medicine

## 2021-01-21 ENCOUNTER — Other Ambulatory Visit: Payer: Self-pay | Admitting: Family Medicine

## 2021-01-21 NOTE — Telephone Encounter (Signed)
Routing to covering provider.  °

## 2021-01-25 ENCOUNTER — Encounter: Payer: Self-pay | Admitting: Family Medicine

## 2021-01-25 ENCOUNTER — Ambulatory Visit (INDEPENDENT_AMBULATORY_CARE_PROVIDER_SITE_OTHER): Payer: 59 | Admitting: Family Medicine

## 2021-01-25 ENCOUNTER — Ambulatory Visit: Payer: 59 | Admitting: Medical-Surgical

## 2021-01-25 DIAGNOSIS — F411 Generalized anxiety disorder: Secondary | ICD-10-CM

## 2021-01-25 MED ORDER — ALPRAZOLAM 0.5 MG PO TABS
0.5000 mg | ORAL_TABLET | Freq: Every day | ORAL | 3 refills | Status: DC | PRN
Start: 2021-01-25 — End: 2021-02-14

## 2021-01-25 NOTE — Progress Notes (Signed)
Jesse Hoover - 30 y.o. male MRN [id number]  Date of birth: 04-04-1991  Subjective No chief complaint on file.   HPI Jesse Hoover is a 30 year old male here today for follow-up visit.  He is a former patient of Dr. Sheppard Coil.  He reports symptoms of increased anxiety over the past several months.  He is currently treated with Lexapro daily and diazepam as needed twice daily.  He does not feel that Lexapro is quite effective enough to manage his anxiety.  He also does not feel that diazepam has been very effective.  He had previously been on clonazepam which worked a little better for him.  He has taken alprazolam previously which seemed to work better for his anxiety and panic symptoms.  Here reports increased stress related to work.  He is looking into having intermittent FMLA for days when he has increased anxiety.  Depression screen [facility] 2/9 01/25/2021 11/23/2020 10/07/2020  Decreased Interest 0 1 3  Down, Depressed, Hopeless 0 1 2  PHQ - 2 Score 0 2 5  Altered sleeping 2 - 3  Tired, decreased energy 2 - 3  Change in appetite 1 - 2  Feeling bad or failure about yourself  1 - 2  Trouble concentrating 2 - 2  Moving slowly or fidgety/restless 0 - 2  Suicidal thoughts 0 - 1  PHQ-9 Score 8 - 20  Difficult doing work/chores Very difficult - Extremely dIfficult   GAD 7 : Generalized Anxiety Score 01/25/2021 10/07/2020 08/22/2019 03/03/2019  Nervous, Anxious, on Edge 3 3 1 3   Control/stop worrying 3 3 1 3   Worry too much - different things 2 3 1 3   Trouble relaxing 2 3 0 3  Restless 0 3 0 3  Easily annoyed or irritable 3 3 0 3  Afraid - awful might happen 3 3 0 3  Total GAD 7 Score 16 21 3 21   Anxiety Difficulty Extremely difficult Extremely difficult Somewhat difficult Very difficult    ROS:  A comprehensive ROS was completed and negative except as noted per HPI  Allergies  Allergen Reactions   Bee Pollen Swelling    Past Medical History:  Diagnosis Date   Generalized anxiety disorder  06/19/2017    Past Surgical History:  Procedure Laterality Date   NO PAST SURGERIES      Social History   Socioeconomic History   Marital status: Married    Spouse name: Katie   Number of children: 2   Years of education: Not on file   Highest education level: Not on file  Occupational History   Occupation: Tech  Tobacco Use   Smoking status: Never   Smokeless tobacco: Never  Vaping Use   Vaping Use: Never used  Substance and Sexual Activity   Alcohol use: Yes    Alcohol/week: 4.0 - 5.0 standard drinks    Types: 4 - 5 Shots of liquor per week   Drug use: Yes    Frequency: 1.0 times per week    Types: Marijuana    Comment: every other weekend   Sexual activity: Yes    Partners: Female    Birth control/protection: None  Other Topics Concern   Not on file  Social History Narrative   Not on file   Social Determinants of Health   Financial Resource Strain: Not on file  Food Insecurity: Not on file  Transportation Needs: Not on file  Physical Activity: Not on file  Stress: Not on file  Social Connections: Not on file  Family History  Problem Relation Age of Onset   Alcoholism Maternal Grandmother    Leukemia Maternal Grandmother    Esophageal cancer Maternal Grandfather    Lung cancer Maternal Grandfather    Multiple myeloma Mother     Health Maintenance  Topic Date Due   Hepatitis C Screening  Never done   COVID-19 Vaccine (3 - Booster for Moderna series) 12/27/2019   INFLUENZA VACCINE  11/08/2020   TETANUS/TDAP  07/18/2027   HIV Screening  Completed   HPV VACCINES  Aged Out     ----------------------------------------------------------------------------------------------------------------------------------------------------------------------------------------------------------------- Physical Exam BP 137/70 (BP Location: Left Arm, Patient Position: Sitting, Cuff Size: Large)   Pulse 69   Ht 6' (1.829 m)   Wt 209 lb (94.8 kg)   SpO2 97%   BMI  28.35 kg/m   Physical Exam Constitutional:      Appearance: Normal appearance.  Eyes:     General: No scleral icterus. Cardiovascular:     Rate and Rhythm: Normal rate and regular rhythm.  Pulmonary:     Effort: Pulmonary effort is normal.     Breath sounds: Normal breath sounds.  Musculoskeletal:     Cervical back: Neck supple.  Neurological:     Mental Status: He is alert.  Psychiatric:     Comments: Somewhat tearful when discussing his anxiety.    ------------------------------------------------------------------------------------------------------------------------------------------------------------------------------------------------------------------- Assessment and Plan  Generalized anxiety disorder He will continue Lexapro at current strength of 20 mg daily.  We did discuss potentially changing to Trintellix to see if this is more effective for him.  He would like to continue with Lexapro at this time.  I will have him discontinue diazepam and transition to alprazolam as needed.  We discussed trying to limit this.  He has good support from family and his church.  He declines referral for additional counseling at this time.    Meds ordered this encounter  Medications   ALPRAZolam (XANAX) 0.5 MG tablet    Sig: Take 1 tablet (0.5 mg total) by mouth daily as needed for anxiety.    Dispense:  30 tablet    Refill:  3    Replaces diazepam    Return in about 5 weeks (around 03/01/2021) for GAD.    This visit occurred during the SARS-CoV-2 public health emergency.  Safety protocols were in place, including screening questions prior to the visit, additional usage of staff PPE, and extensive cleaning of exam room while observing appropriate contact time as indicated for disinfecting solutions.

## 2021-01-25 NOTE — Assessment & Plan Note (Signed)
He will continue Lexapro at current strength of 20 mg daily.  We did discuss potentially changing to Trintellix to see if this is more effective for him.  He would like to continue with Lexapro at this time.  I will have him discontinue diazepam and transition to alprazolam as needed.  We discussed trying to limit this.  He has good support from family and his church.  He declines referral for additional counseling at this time.

## 2021-01-25 NOTE — Patient Instructions (Signed)
Nice to see you today! Replace diazepam with alprazolam.  Try to limit use of this.  Don't take it if you don't need it.  Continue lexapro at current strength.  Follow up in 4-6 weeks.

## 2021-01-28 ENCOUNTER — Telehealth: Payer: Self-pay

## 2021-01-28 NOTE — Telephone Encounter (Signed)
Please call the patient and advise the disability documentation was completed and faxed.   Copy available for pick-up.

## 2021-01-28 NOTE — Telephone Encounter (Signed)
Let patient know his copy is ready to be picked up and placed in accordion. AM

## 2021-02-09 ENCOUNTER — Encounter: Payer: Self-pay | Admitting: Family Medicine

## 2021-02-09 DIAGNOSIS — L659 Nonscarring hair loss, unspecified: Secondary | ICD-10-CM

## 2021-02-14 ENCOUNTER — Other Ambulatory Visit: Payer: Self-pay | Admitting: Family Medicine

## 2021-02-14 MED ORDER — ALPRAZOLAM 0.5 MG PO TABS: 0.5000 mg | ORAL_TABLET | Freq: Two times a day (BID) | ORAL | 3 refills | Status: DC | PRN

## 2021-02-25 ENCOUNTER — Ambulatory Visit (INDEPENDENT_AMBULATORY_CARE_PROVIDER_SITE_OTHER): Payer: 59 | Admitting: Family Medicine

## 2021-02-25 ENCOUNTER — Encounter: Payer: Self-pay | Admitting: Family Medicine

## 2021-02-25 ENCOUNTER — Other Ambulatory Visit: Payer: Self-pay

## 2021-02-25 DIAGNOSIS — F411 Generalized anxiety disorder: Secondary | ICD-10-CM

## 2021-02-25 DIAGNOSIS — N529 Male erectile dysfunction, unspecified: Secondary | ICD-10-CM | POA: Diagnosis not present

## 2021-02-25 MED ORDER — SILDENAFIL CITRATE 100 MG PO TABS: 50.0000 mg | ORAL_TABLET | Freq: Every day | ORAL | 3 refills | Status: DC | PRN

## 2021-02-25 MED ORDER — LISDEXAMFETAMINE DIMESYLATE 30 MG PO CAPS: 30.0000 mg | ORAL_CAPSULE | Freq: Every day | ORAL | 0 refills | Status: DC

## 2021-02-25 NOTE — Patient Instructions (Signed)
Reduce lexapro to 1/2 pill Let's try Vyvanse at 30mg  daily.  Check in with me in 1 month.

## 2021-02-27 DIAGNOSIS — N529 Male erectile dysfunction, unspecified: Secondary | ICD-10-CM | POA: Insufficient documentation

## 2021-02-27 NOTE — Assessment & Plan Note (Signed)
Adding sildenafil as needed.  Side effects reviewed.

## 2021-02-27 NOTE — Progress Notes (Signed)
Jesse Hoover - 30 y.o. male MRN [id number]  Date of birth: Dec 22, 1990  Subjective Chief Complaint  Patient presents with   Anxiety    HPI Jesse Hoover is a 30 year old male here today for follow-up of anxiety.  He was continued on Lexapro at 20 mg daily.  Alprazolam was also added at previous visit to use as needed.  He is using this nearly every day.  He is not sure why he has anxiety as he feels like his life is pretty good and has good support from family.  He does reports that his anxiety is oftentimes exacerbated by foggy thinking as well as decreased ability to complete task and concentrate on activities at work.  He does have history of ADD as a child.  This was previously treated with Vyvanse as a teenager or young adult.  Is also having some erectile issues which he thinks may be related to Lexapro.  Requesting trial of medication to help with this.  He would like to try weaning back on Lexapro.  ROS:  A comprehensive ROS was completed and negative except as noted per HPI  Allergies  Allergen Reactions   Bee Pollen Swelling    Past Medical History:  Diagnosis Date   Generalized anxiety disorder 06/19/2017    Past Surgical History:  Procedure Laterality Date   NO PAST SURGERIES      Social History   Socioeconomic History   Marital status: Married    Spouse name: Katie   Number of children: 2   Years of education: Not on file   Highest education level: Not on file  Occupational History   Occupation: Tech  Tobacco Use   Smoking status: Never   Smokeless tobacco: Never  Vaping Use   Vaping Use: Never used  Substance and Sexual Activity   Alcohol use: Yes    Alcohol/week: 4.0 - 5.0 standard drinks    Types: 4 - 5 Shots of liquor per week   Drug use: Yes    Frequency: 1.0 times per week    Types: Marijuana    Comment: every other weekend   Sexual activity: Yes    Partners: Female    Birth control/protection: None  Other Topics Concern   Not on file  Social  History Narrative   Not on file   Social Determinants of Health   Financial Resource Strain: Not on file  Food Insecurity: Not on file  Transportation Needs: Not on file  Physical Activity: Not on file  Stress: Not on file  Social Connections: Not on file    Family History  Problem Relation Age of Onset   Alcoholism Maternal Grandmother    Leukemia Maternal Grandmother    Esophageal cancer Maternal Grandfather    Lung cancer Maternal Grandfather    Multiple myeloma Mother     Health Maintenance  Topic Date Due   Hepatitis C Screening  Never done   COVID-19 Vaccine (3 - Booster for Moderna series) 09/21/2019   INFLUENZA VACCINE  11/08/2020   TETANUS/TDAP  07/18/2027   HIV Screening  Completed   Pneumococcal Vaccine 37-65 Years old  Aged Out   HPV VACCINES  Aged Out     ----------------------------------------------------------------------------------------------------------------------------------------------------------------------------------------------------------------- Physical Exam BP 128/72 (BP Location: Left Arm, Patient Position: Sitting, Cuff Size: Large)   Pulse 68   Temp 98 F (36.7 C)   Ht 6' (1.829 m)   Wt 201 lb (91.2 kg)   SpO2 97%   BMI 27.26 kg/m   Physical  Exam Constitutional:      Appearance: Normal appearance.  Eyes:     General: No scleral icterus. Musculoskeletal:     Cervical back: Neck supple.  Neurological:     General: No focal deficit present.     Mental Status: He is alert.  Psychiatric:        Mood and Affect: Mood normal.        Behavior: Behavior normal.    ------------------------------------------------------------------------------------------------------------------------------------------------------------------------------------------------------------------- Assessment and Plan  Generalized anxiety disorder He has been having some side effects related to Lexapro including some erectile issues and foggy thinking.   Reducing Lexapro to 10 mg daily.  I do think that he may have some persistence of ADD that may be contributing to his concentration difficulty and anxiety at work.  We discussed adding back on a trial of Vyvanse which he is in agreement to.  I would like to get him off of alprazolam as soon as we can.  Erectile dysfunction Adding sildenafil as needed.  Side effects reviewed.   Meds ordered this encounter  Medications   lisdexamfetamine (VYVANSE) 30 MG capsule    Sig: Take 1 capsule (30 mg total) by mouth daily.    Dispense:  30 capsule    Refill:  0   sildenafil (VIAGRA) 100 MG tablet    Sig: Take 0.5-1 tablets (50-100 mg total) by mouth daily as needed for erectile dysfunction.    Dispense:  10 tablet    Refill:  3    No follow-ups on file.    This visit occurred during the SARS-CoV-2 public health emergency.  Safety protocols were in place, including screening questions prior to the visit, additional usage of staff PPE, and extensive cleaning of exam room while observing appropriate contact time as indicated for disinfecting solutions.

## 2021-02-27 NOTE — Assessment & Plan Note (Signed)
He has been having some side effects related to Lexapro including some erectile issues and foggy thinking.  Reducing Lexapro to 10 mg daily.  I do think that he may have some persistence of ADD that may be contributing to his concentration difficulty and anxiety at work.  We discussed adding back on a trial of Vyvanse which he is in agreement to.  I would like to get him off of alprazolam as soon as we can.

## 2021-03-21 MED ORDER — LISDEXAMFETAMINE DIMESYLATE 30 MG PO CAPS: 30.0000 mg | ORAL_CAPSULE | Freq: Every day | ORAL | 0 refills | Status: DC

## 2021-03-21 NOTE — Telephone Encounter (Signed)
Meds ordered this encounter  °Medications  ° lisdexamfetamine (VYVANSE) 30 MG capsule  °  Sig: Take 1 capsule (30 mg total) by mouth daily.  °  Dispense:  30 capsule  °  Refill:  0  ° ° °

## 2021-03-21 NOTE — Telephone Encounter (Signed)
Patient schedule follow-up appointment and then route back to me.

## 2021-03-21 NOTE — Telephone Encounter (Signed)
Pt has been scheduled for f/u with Dr Ashley Royalty on 03/30/21.

## 2021-03-21 NOTE — Telephone Encounter (Signed)
Epic would not allow me to mark Vyvanse for refill.  Please review request and refill if appropriate.  Tiajuana Amass, CMA

## 2021-03-29 ENCOUNTER — Telehealth: Payer: Self-pay

## 2021-03-29 DIAGNOSIS — F988 Other specified behavioral and emotional disorders with onset usually occurring in childhood and adolescence: Secondary | ICD-10-CM | POA: Insufficient documentation

## 2021-03-29 NOTE — Telephone Encounter (Signed)
Medication: lisdexamfetamine (VYVANSE) 30 MG capsule Prior authorization submitted via CoverMyMeds on 03/29/2021 PA submission pending m

## 2021-03-29 NOTE — Telephone Encounter (Signed)
Medication: lisdexamfetamine (VYVANSE) 30 MG capsule Prior authorization determination received Medication has been approved Approval dates: 02/27/2021-03/29/2022  Patient aware via: MyChart Pharmacy aware: Yes Provider aware via this encounter

## 2021-03-30 ENCOUNTER — Other Ambulatory Visit: Payer: Self-pay

## 2021-03-30 ENCOUNTER — Encounter: Payer: Self-pay | Admitting: Family Medicine

## 2021-03-30 ENCOUNTER — Ambulatory Visit (INDEPENDENT_AMBULATORY_CARE_PROVIDER_SITE_OTHER): Payer: 59 | Admitting: Family Medicine

## 2021-03-30 DIAGNOSIS — F988 Other specified behavioral and emotional disorders with onset usually occurring in childhood and adolescence: Secondary | ICD-10-CM | POA: Diagnosis not present

## 2021-03-30 NOTE — Assessment & Plan Note (Signed)
Printed coupon for Vyvanse and he will see if this reduces price not for him to fill medication.  Discussed that if this is still cost prohibitive we can change to Adderall or Adderall XR.

## 2021-03-30 NOTE — Progress Notes (Signed)
Jesse Hoover - 30 y.o. male MRN [id number]  Date of birth: [birth year]  Subjective Chief Complaint  Patient presents with   Medication Refill    HPI Jesse Hoover is a 30 year old male here today for follow-up of anxiety and ADD.  Vyvanse sent him at previous appointment were shortly following up on this today however he reports that Vyvanse was too expensive even after insurance approval.    ROS:  A comprehensive ROS was completed and negative except as noted per HPI  Allergies  Allergen Reactions   Bee Pollen Swelling    Past Medical History:  Diagnosis Date   Generalized anxiety disorder 06/19/2017    Past Surgical History:  Procedure Laterality Date   NO PAST SURGERIES      Social History   Socioeconomic History   Marital status: Married    Spouse name: Katie   Number of children: 2   Years of education: Not on file   Highest education level: Not on file  Occupational History   Occupation: Tech  Tobacco Use   Smoking status: Never   Smokeless tobacco: Never  Vaping Use   Vaping Use: Never used  Substance and Sexual Activity   Alcohol use: Yes    Alcohol/week: 4.0 - 5.0 standard drinks    Types: 4 - 5 Shots of liquor per week   Drug use: Yes    Frequency: 1.0 times per week    Types: Marijuana    Comment: every other weekend   Sexual activity: Yes    Partners: Female    Birth control/protection: None  Other Topics Concern   Not on file  Social History Narrative   Not on file   Social Determinants of Health   Financial Resource Strain: Not on file  Food Insecurity: Not on file  Transportation Needs: Not on file  Physical Activity: Not on file  Stress: Not on file  Social Connections: Not on file    Family History  Problem Relation Age of Onset   Alcoholism Maternal Grandmother    Leukemia Maternal Grandmother    Esophageal cancer Maternal Grandfather    Lung cancer Maternal Grandfather    Multiple myeloma Mother     Health Maintenance  Topic  Date Due   Hepatitis C Screening  Never done   COVID-19 Vaccine (3 - Booster for Moderna series) 09/21/2019   TETANUS/TDAP  07/18/2027   INFLUENZA VACCINE  Completed   HIV Screening  Completed   Pneumococcal Vaccine 5-37 Years old  Aged Out   HPV VACCINES  Aged Out     ----------------------------------------------------------------------------------------------------------------------------------------------------------------------------------------------------------------- Physical Exam BP 101/75 (BP Location: Left Arm, Patient Position: Sitting, Cuff Size: Large)    Pulse 73    Temp 98.1 F (36.7 C) (Oral)    Ht 6' 1"  (1.854 m)    Wt 208 lb 1.9 oz (94.4 kg)    SpO2 99%    BMI 27.46 kg/m   Physical Exam Constitutional:      Appearance: Normal appearance.  Eyes:     General: No scleral icterus. Cardiovascular:     Rate and Rhythm: Normal rate and regular rhythm.  Musculoskeletal:     Cervical back: Neck supple.  Neurological:     General: No focal deficit present.     Mental Status: He is alert.  Psychiatric:        Mood and Affect: Mood normal.        Behavior: Behavior normal.    ------------------------------------------------------------------------------------------------------------------------------------------------------------------------------------------------------------------- Assessment and Plan  ADD (attention  deficit disorder) Printed coupon for Vyvanse and he will see if this reduces price not for him to fill medication.  Discussed that if this is still cost prohibitive we can change to Adderall or Adderall XR.   No orders of the defined types were placed in this encounter.   No follow-ups on file.    This visit occurred during the SARS-CoV-2 public health emergency.  Safety protocols were in place, including screening questions prior to the visit, additional usage of staff PPE, and extensive cleaning of exam room while observing appropriate contact  time as indicated for disinfecting solutions.

## 2021-04-12 ENCOUNTER — Telehealth: Payer: Self-pay

## 2021-04-12 NOTE — Telephone Encounter (Signed)
Pt lvm requesting callback from Dr. Zigmund Daniel.   Returned pt's call. He is concerned about receiving a bill for $210 for an office visit when he had a follow-up.   Advised pt that all office visits are coded as such. Only physicals are coded differently. He wants to know why a 6 minute follow-up visit costs the same as his initial visit. (Pt has high deductible plan without copays. He does not understand that he will have to pay for each visit until the deductible is met)  Per Dr. Zigmund Daniel, he has been advised that the Office Manager Nunzio Cory) will contact him concerning billing issues.   An email with this information has been forwarded to her.

## 2021-04-12 NOTE — Telephone Encounter (Signed)
Vyvanse is too expensive. Alternative?

## 2021-04-13 MED ORDER — AMPHETAMINE-DEXTROAMPHETAMINE 15 MG PO TABS: 15.0000 mg | ORAL_TABLET | Freq: Two times a day (BID) | ORAL | 0 refills | Status: DC

## 2021-04-13 NOTE — Telephone Encounter (Signed)
Pls review previous encounter.

## 2021-04-13 NOTE — Addendum Note (Signed)
Addended by: Mammie Lorenzo on: 04/13/2021 04:58 PM   Modules accepted: Orders

## 2021-04-13 NOTE — Telephone Encounter (Signed)
Per patient - he is okay with provider sending in Adderrall rx to the pharmacy on file.

## 2021-04-13 NOTE — Telephone Encounter (Signed)
Adderall sent.  Follow up in 3 months or sooner if needed.

## 2021-04-13 NOTE — Telephone Encounter (Signed)
We can do adderall or adderall xr if he want to try this.  Adderall is probably going to be the cheaper of the two.

## 2021-04-13 NOTE — Telephone Encounter (Signed)
Task completed. Patient made aware that Adderall rx sent to the pharmacy. Patient will make a virtual appointment as advised by provider. No other inquiries during the call.

## 2021-04-27 ENCOUNTER — Ambulatory Visit (INDEPENDENT_AMBULATORY_CARE_PROVIDER_SITE_OTHER): Payer: 59

## 2021-04-27 ENCOUNTER — Other Ambulatory Visit: Payer: Self-pay | Admitting: Family Medicine

## 2021-04-27 ENCOUNTER — Ambulatory Visit (INDEPENDENT_AMBULATORY_CARE_PROVIDER_SITE_OTHER): Payer: 59 | Admitting: Family Medicine

## 2021-04-27 ENCOUNTER — Other Ambulatory Visit: Payer: Self-pay

## 2021-04-27 ENCOUNTER — Encounter: Payer: Self-pay | Admitting: Family Medicine

## 2021-04-27 VITALS — BP 128/72 | HR 65 | Temp 97.7°F | Ht 72.0 in | Wt 210.0 lb

## 2021-04-27 DIAGNOSIS — R1084 Generalized abdominal pain: Secondary | ICD-10-CM

## 2021-04-27 DIAGNOSIS — R1031 Right lower quadrant pain: Secondary | ICD-10-CM

## 2021-04-27 NOTE — Assessment & Plan Note (Signed)
Nonspecific generalized abdominal pain.  He does have a small nodule that appears to be a lipoma adjacent to the umbilicus.  Abdominal ultrasound ordered.

## 2021-04-27 NOTE — Progress Notes (Signed)
Jesse Hoover - 31 y.o. male MRN [id number]  Date of birth: 02/09/1991  Subjective No chief complaint on file.   HPI Jesse Hoover is a 31 year old male here today with complaint of abdominal pain.  Reports feeling of sharp abdominal pain in the right lower quadrant area a few days ago.  This did radiate into the scrotum.  He did not notice any testicular tenderness and has not noticed any nodules.  Felt he noticed a bulge in his groin area and pushed on this and had some relief of pain.  Pain has improved quite a bit at this time.  He has noticed a nodule adjacent to his bellybutton as well.  He denies fever, chills, nausea, vomiting or changes to bowel movements.  ROS:  A comprehensive ROS was completed and negative except as noted per HPI  Allergies  Allergen Reactions   Bee Pollen Swelling    Past Medical History:  Diagnosis Date   Generalized anxiety disorder 06/19/2017    Past Surgical History:  Procedure Laterality Date   NO PAST SURGERIES      Social History   Socioeconomic History   Marital status: Married    Spouse name: Katie   Number of children: 2   Years of education: Not on file   Highest education level: Not on file  Occupational History   Occupation: Tech  Tobacco Use   Smoking status: Never   Smokeless tobacco: Never  Vaping Use   Vaping Use: Never used  Substance and Sexual Activity   Alcohol use: Yes    Alcohol/week: 4.0 - 5.0 standard drinks    Types: 4 - 5 Shots of liquor per week   Drug use: Yes    Frequency: 1.0 times per week    Types: Marijuana    Comment: every other weekend   Sexual activity: Yes    Partners: Female    Birth control/protection: None  Other Topics Concern   Not on file  Social History Narrative   Not on file   Social Determinants of Health   Financial Resource Strain: Not on file  Food Insecurity: Not on file  Transportation Needs: Not on file  Physical Activity: Not on file  Stress: Not on file  Social Connections:  Not on file    Family History  Problem Relation Age of Onset   Alcoholism Maternal Grandmother    Leukemia Maternal Grandmother    Esophageal cancer Maternal Grandfather    Lung cancer Maternal Grandfather    Multiple myeloma Mother     Health Maintenance  Topic Date Due   Hepatitis C Screening  Never done   COVID-19 Vaccine (3 - Booster for Moderna series) 09/21/2019   TETANUS/TDAP  07/18/2027   INFLUENZA VACCINE  Completed   HIV Screening  Completed   Pneumococcal Vaccine 30-8 Years old  Aged Out   HPV VACCINES  Aged Out     ----------------------------------------------------------------------------------------------------------------------------------------------------------------------------------------------------------------- Physical Exam BP 128/72 (BP Location: Left Arm, Patient Position: Sitting, Cuff Size: Normal)    Pulse 65    Temp 97.7 F (36.5 C)    Ht 6' (1.829 m)    Wt 210 lb (95.3 kg)    SpO2 96%    BMI 28.48 kg/m   Physical Exam Constitutional:      Appearance: Normal appearance.  Eyes:     General: No scleral icterus. Cardiovascular:     Rate and Rhythm: Normal rate and regular rhythm.  Pulmonary:     Effort: Pulmonary effort is normal.  Breath sounds: Normal breath sounds.  Abdominal:     General: Abdomen is flat. There is no distension.     Palpations: Abdomen is soft. There is mass (Small nodule adjacent to the umbilicus on the right side consistent with lipoma.).     Tenderness: There is no abdominal tenderness. There is no guarding.     Hernia: No hernia is present.  Genitourinary:    Testes: Normal.  Musculoskeletal:     Cervical back: Neck supple.  Neurological:     General: No focal deficit present.     Mental Status: He is alert.  Psychiatric:        Mood and Affect: Mood normal.        Behavior: Behavior normal.     ------------------------------------------------------------------------------------------------------------------------------------------------------------------------------------------------------------------- Assessment and Plan  Right inguinal pain No hernia palpated.  Normal testicular exam without nodules or tenderness.  Ultrasound of inguinal area ordered today.  Generalized abdominal pain Nonspecific generalized abdominal pain.  He does have a small nodule that appears to be a lipoma adjacent to the umbilicus.  Abdominal ultrasound ordered.   No orders of the defined types were placed in this encounter.   No follow-ups on file.    This visit occurred during the SARS-CoV-2 public health emergency.  Safety protocols were in place, including screening questions prior to the visit, additional usage of staff PPE, and extensive cleaning of exam room while observing appropriate contact time as indicated for disinfecting solutions.

## 2021-04-27 NOTE — Assessment & Plan Note (Signed)
No hernia palpated.  Normal testicular exam without nodules or tenderness.  Ultrasound of inguinal area ordered today.

## 2021-05-08 ENCOUNTER — Other Ambulatory Visit: Payer: Self-pay | Admitting: Osteopathic Medicine

## 2021-05-25 ENCOUNTER — Other Ambulatory Visit: Payer: Self-pay | Admitting: Family Medicine

## 2021-05-26 ENCOUNTER — Other Ambulatory Visit: Payer: Self-pay | Admitting: Family Medicine

## 2021-05-27 ENCOUNTER — Other Ambulatory Visit: Payer: Self-pay | Admitting: Family Medicine

## 2021-06-07 ENCOUNTER — Other Ambulatory Visit: Payer: Self-pay | Admitting: Family Medicine

## 2021-06-20 ENCOUNTER — Other Ambulatory Visit: Payer: Self-pay | Admitting: Family Medicine

## 2021-07-07 ENCOUNTER — Other Ambulatory Visit: Payer: Self-pay | Admitting: Family Medicine

## 2021-07-26 ENCOUNTER — Other Ambulatory Visit: Payer: Self-pay | Admitting: Family Medicine

## 2021-08-30 ENCOUNTER — Other Ambulatory Visit: Payer: Self-pay | Admitting: Family Medicine

## 2021-08-30 NOTE — Telephone Encounter (Signed)
Needs f/u to discuss additional refills if he is using medication this often.

## 2021-09-01 NOTE — Telephone Encounter (Signed)
Pls contact the patient to schedule a follow-up appt for continued Xanax use. Per Dr. Ashley Royalty, follow-up appt is recommended if patient is having continuous medication refills. Thanks

## 2021-09-01 NOTE — Telephone Encounter (Signed)
Patient scheduled for virtual visit on 09/06/21. AMUCK

## 2021-09-06 ENCOUNTER — Telehealth: Payer: 59 | Admitting: Family Medicine

## 2021-09-09 ENCOUNTER — Telehealth: Payer: Self-pay | Admitting: Family Medicine

## 2021-09-09 NOTE — Telephone Encounter (Signed)
Pt states he is experiencing nausea, light headness a

## 2021-09-12 ENCOUNTER — Encounter: Payer: Self-pay | Admitting: Family Medicine

## 2021-09-12 ENCOUNTER — Telehealth (INDEPENDENT_AMBULATORY_CARE_PROVIDER_SITE_OTHER): Payer: 59 | Admitting: Family Medicine

## 2021-09-12 DIAGNOSIS — F411 Generalized anxiety disorder: Secondary | ICD-10-CM | POA: Diagnosis not present

## 2021-09-12 MED ORDER — ESCITALOPRAM OXALATE 20 MG PO TABS: ORAL_TABLET | ORAL | 1 refills | Status: DC

## 2021-09-12 MED ORDER — ALPRAZOLAM 0.5 MG PO TABS: 0.5000 mg | ORAL_TABLET | Freq: Two times a day (BID) | ORAL | 1 refills | Status: DC | PRN

## 2021-09-12 NOTE — Assessment & Plan Note (Signed)
Discussed limiting alprazolam use.  Will not provide more than #45/month.  Adding lexapro back on 1/2 tab x1 week then increase to full tab.  F/u in 2 months.

## 2021-09-12 NOTE — Progress Notes (Signed)
Jesse Hoover - 31 y.o. male MRN [id number]  Date of birth: April 08, 1991   This visit type was conducted due to national recommendations for restrictions regarding the COVID-19 Pandemic (e.g. social distancing).  This format is felt to be most appropriate for this patient at this time.  All issues noted in this document were discussed and addressed.  No physical exam was performed (except for noted visual exam findings with Video Visits).  I discussed the limitations of evaluation and management by telemedicine and the availability of in person appointments. The patient expressed understanding and agreed to proceed.  I connected withNAME@ on 09/12/21 at  1:10 PM EDT by a video enabled telemedicine application and verified that I am speaking with the correct person using two identifiers.  Present at visit: Luetta Nutting, DO O[place] Balm   Patient Location: [place] [address]   Provider location:   [place]  No chief complaint on file.   HPI  Jesse Hoover is a 31 y.o. male who presents via audio/video conferencing for a telehealth visit today.  He is following up today for anxiety.  He reports that he is taking alprazolam 1-2x daily.  Usually once at work and another in the evening.  He is no longer using lexapro but feels like he needs to restart this.  He has not had any side effects related to medications.    ROS:  A comprehensive ROS was completed and negative except as noted per HPI    ROS:  A comprehensive ROS was completed and negative except as noted per HPI  Past Medical History:  Diagnosis Date   Generalized anxiety disorder 06/19/2017    Past Surgical History:  Procedure Laterality Date   NO PAST SURGERIES      Family History  Problem Relation Age of Onset   Alcoholism Maternal Grandmother    Leukemia Maternal Grandmother    Esophageal cancer Maternal Grandfather    Lung cancer Maternal Grandfather    Multiple myeloma Mother      Social History   Socioeconomic History   Marital status: Married    Spouse name: Katie   Number of children: 2   Years of education: Not on file   Highest education level: Not on file  Occupational History   Occupation: Tech  Tobacco Use   Smoking status: Never   Smokeless tobacco: Never  Vaping Use   Vaping Use: Never used  Substance and Sexual Activity   Alcohol use: Yes    Alcohol/week: 4.0 - 5.0 standard drinks    Types: 4 - 5 Shots of liquor per week   Drug use: Yes    Frequency: 1.0 times per week    Types: Marijuana    Comment: every other weekend   Sexual activity: Yes    Partners: Female    Birth control/protection: None  Other Topics Concern   Not on file  Social History Narrative   Not on file   Social Determinants of Health   Financial Resource Strain: Not on file  Food Insecurity: Not on file  Transportation Needs: Not on file  Physical Activity: Not on file  Stress: Not on file  Social Connections: Not on file  Intimate Partner Violence: Not on file     Current Outpatient Medications:    amphetamine-dextroamphetamine (ADDERALL) 15 MG tablet, Take 1 tablet by mouth 2 (two) times daily., Disp: 60 tablet, Rfl: 0   diclofenac sodium (VOLTAREN) 1 % GEL, Apply 4  g topically 4 (four) times daily. To affected joint., Disp: 100 g, Rfl: 11   finasteride (PROSCAR) 5 MG tablet, TAKE 1/2 TO 1 (ONE-HALF TO ONE) TABLET BY MOUTH ONCE DAILY, Disp: 30 tablet, Rfl: 0   ketoconazole (NIZORAL) 2 % shampoo, Apply 1 application topically 2 (two) times a week., Disp: , Rfl:    Minoxidil 5 % FOAM, Apply 1/2 capful topically twice daily, Disp: 60 g, Rfl: 2   sildenafil (VIAGRA) 100 MG tablet, TAKE 1/2 TO 1 (ONE-HALF TO ONE) TABLET BY MOUTH ONCE DAILY AS NEEDED FOR ERECTILE DYSFUNCTION, Disp: 10 tablet, Rfl: 0   ALPRAZolam (XANAX) 0.5 MG tablet, Take 1 tablet (0.5 mg total) by mouth 2 (two) times daily as needed. for anxiety, Disp: 45 tablet, Rfl: 1   escitalopram  (LEXAPRO) 20 MG tablet, Take 1/2 tab po daily x1 week then increase to full tab, Disp: 90 tablet, Rfl: 1  EXAM:  VITALS per patient if applicable: Ht 6' (5.789 m)   Wt 210 lb (95.3 kg)   BMI 28.48 kg/m   GENERAL: alert, oriented, appears well and in no acute distress  HEENT: atraumatic, conjunttiva clear, no obvious abnormalities on inspection of external nose and ears  NECK: normal movements of the head and neck  LUNGS: on inspection no signs of respiratory distress, breathing rate appears normal, no obvious gross SOB, gasping or wheezing  CV: no obvious cyanosis  MS: moves all visible extremities without noticeable abnormality  PSYCH/NEURO: pleasant and cooperative, no obvious depression or anxiety, speech and thought processing grossly intact  ASSESSMENT AND PLAN:  Discussed the following assessment and plan:  Generalized anxiety disorder Discussed limiting alprazolam use.  Will not provide more than #45/month.  Adding lexapro back on 1/2 tab x1 week then increase to full tab.  F/u in 2 months.      I discussed the assessment and treatment plan with the patient. The patient was provided an opportunity to ask questions and all were answered. The patient agreed with the plan and demonstrated an understanding of the instructions.   The patient was advised to call back or seek an in-person evaluation if the symptoms worsen or if the condition fails to improve as anticipated.    Luetta Nutting, DO

## 2021-10-12 ENCOUNTER — Other Ambulatory Visit: Payer: Self-pay | Admitting: Family Medicine

## 2021-11-15 ENCOUNTER — Encounter: Payer: Self-pay | Admitting: Family Medicine

## 2021-11-16 ENCOUNTER — Ambulatory Visit: Payer: Self-pay | Admitting: Family Medicine

## 2021-11-16 DIAGNOSIS — Z91199 Patient's noncompliance with other medical treatment and regimen due to unspecified reason: Secondary | ICD-10-CM

## 2021-11-28 ENCOUNTER — Other Ambulatory Visit: Payer: Self-pay | Admitting: Family Medicine

## 2021-12-14 ENCOUNTER — Encounter: Payer: Self-pay | Admitting: Family Medicine

## 2022-02-03 ENCOUNTER — Other Ambulatory Visit: Payer: Self-pay | Admitting: Family Medicine

## 2022-02-10 ENCOUNTER — Other Ambulatory Visit: Payer: Self-pay | Admitting: Family Medicine

## 2022-03-15 ENCOUNTER — Ambulatory Visit: Payer: 59 | Admitting: Family Medicine

## 2022-04-17 ENCOUNTER — Telehealth: Payer: Self-pay | Admitting: General Practice

## 2022-04-17 NOTE — Telephone Encounter (Signed)
Transition Care Management Unsuccessful Follow-up Telephone Call  Date of discharge and from where:  04/14/22 from [facility]  Attempts:  1st Attempt  Reason for unsuccessful TCM follow-up call:  Left voice message

## 2022-04-21 NOTE — Telephone Encounter (Signed)
Transition Care Management Unsuccessful Follow-up Telephone Call  Date of discharge and from where:  04/14/22 from [facility]  Attempts:  2nd Attempt  Reason for unsuccessful TCM follow-up call:  Left voice message

## 2022-04-24 NOTE — Telephone Encounter (Signed)
Transition Care Management Unsuccessful Follow-up Telephone Call  Date of discharge and from where:  04/14/22 from [facility]   Attempts:  3rd Attempt  Reason for unsuccessful TCM follow-up call:  Left voice message

## 2022-05-19 DIAGNOSIS — S12601A Unspecified nondisplaced fracture of seventh cervical vertebra, initial encounter for closed fracture: Secondary | ICD-10-CM | POA: Insufficient documentation

## 2022-05-19 DIAGNOSIS — T1490XA Injury, unspecified, initial encounter: Secondary | ICD-10-CM | POA: Insufficient documentation

## 2022-05-19 DIAGNOSIS — S22060A Wedge compression fracture of T7-T8 vertebra, initial encounter for closed fracture: Secondary | ICD-10-CM | POA: Insufficient documentation

## 2022-05-26 ENCOUNTER — Telehealth: Payer: Self-pay

## 2022-05-26 NOTE — Transitions of Care (Post Inpatient/ED Visit) (Unsigned)
   05/26/2022  Name: Jesse Hoover MRN: [id number] DOB: Sep 25, 1990  Today's TOC FU Call Status: Today's TOC FU Call Status:: Unsuccessul Call (1st Attempt) Unsuccessful Call (1st Attempt) Date: 05/26/22  Attempted to reach the patient regarding the most recent Inpatient/ED visit.  Follow Up Plan: Additional outreach attempts will be made to reach the patient to complete the Transitions of Care (Post Inpatient/ED visit) call.   Signature Juanda Crum[place], [place] Direct Dial [phone]
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
## 2022-06-01 ENCOUNTER — Inpatient Hospital Stay: Payer: 59 | Admitting: Family Medicine

## 2022-06-05 ENCOUNTER — Ambulatory Visit (INDEPENDENT_AMBULATORY_CARE_PROVIDER_SITE_OTHER): Payer: 59

## 2022-06-05 ENCOUNTER — Ambulatory Visit (INDEPENDENT_AMBULATORY_CARE_PROVIDER_SITE_OTHER): Payer: 59 | Admitting: Family Medicine

## 2022-06-05 ENCOUNTER — Encounter: Payer: Self-pay | Admitting: Family Medicine

## 2022-06-05 DIAGNOSIS — S12691D Other nondisplaced fracture of seventh cervical vertebra, subsequent encounter for fracture with routine healing: Secondary | ICD-10-CM | POA: Diagnosis not present

## 2022-06-05 DIAGNOSIS — S270XXA Traumatic pneumothorax, initial encounter: Secondary | ICD-10-CM

## 2022-06-05 DIAGNOSIS — T1490XA Injury, unspecified, initial encounter: Secondary | ICD-10-CM | POA: Diagnosis not present

## 2022-06-05 MED ORDER — HYDROXYZINE PAMOATE 25 MG PO CAPS: 25.0000 mg | ORAL_CAPSULE | Freq: Three times a day (TID) | ORAL | 1 refills | Status: DC | PRN
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
## 2022-06-05 NOTE — Assessment & Plan Note (Signed)
History of traumatic pneumothorax.  He does have some difficulty taking a deep breath.  Provided with incentive spirometer today.  Chest x-ray ordered.

## 2022-06-05 NOTE — Patient Instructions (Addendum)
Continue current pain medication as needed.  Use incentive spirometer every hour. Ask spine surgery if they think the shoulder pain could be coming from the neck.  If they do not think the neck is contributing let's get you set up for an MRI of your shoulder.  Continue bowel regimen while taking pain medication.   See me again in 1 month.

## 2022-06-09 ENCOUNTER — Encounter: Payer: Self-pay | Admitting: Family Medicine

## 2022-06-15 ENCOUNTER — Other Ambulatory Visit: Payer: Self-pay | Admitting: Family Medicine

## 2022-06-15 MED ORDER — FINASTERIDE 5 MG PO TABS: ORAL_TABLET | ORAL | 1 refills | Status: DC

## 2022-07-04 ENCOUNTER — Encounter: Payer: Self-pay | Admitting: Family Medicine

## 2022-07-04 ENCOUNTER — Ambulatory Visit (INDEPENDENT_AMBULATORY_CARE_PROVIDER_SITE_OTHER): Payer: Managed Care, Other (non HMO) | Admitting: Family Medicine

## 2022-07-04 DIAGNOSIS — F411 Generalized anxiety disorder: Secondary | ICD-10-CM

## 2022-07-04 MED ORDER — ALPRAZOLAM 0.5 MG PO TABS: 0.5000 mg | ORAL_TABLET | Freq: Two times a day (BID) | ORAL | 1 refills | Status: DC | PRN

## 2022-07-04 NOTE — Assessment & Plan Note (Signed)
Continues to see the spine surgery.  He will remain in c-collar for another couple weeks.  Continues have some shoulder pain, overall this is improved.  We discussed that if not resolving we can consider additional imaging.  Recommend trial of Voltaren gel for knee pain.

## 2022-07-04 NOTE — Assessment & Plan Note (Signed)
Having increased anxiety.  Adding alprazolam back on.  We discussed adding bupropion as well given his adjustment with depression.Return in about 3 months (around 10/04/2022) for Anxiety.
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
## 2022-07-14 ENCOUNTER — Other Ambulatory Visit: Payer: Self-pay | Admitting: Family Medicine

## 2022-07-14 NOTE — Telephone Encounter (Signed)
Rx for sildenafil not listed in active med list.

## 2022-09-29 ENCOUNTER — Ambulatory Visit: Payer: 59 | Admitting: Family Medicine

## 2022-10-19 ENCOUNTER — Other Ambulatory Visit: Payer: Self-pay | Admitting: Family Medicine

## 2022-10-20 NOTE — Telephone Encounter (Signed)
Last OV: 07/04/22 Next OV: none scheduled Last RF: 07/04/22

## 2022-11-03 IMAGING — US US ABDOMEN LIMITED
1 series · 14 of 20 positions shown · non-contrast
Comparison: None.

CLINICAL DATA: Generalized abdominal and right groin pain.

EXAM:
ULTRASOUND ABDOMEN LIMITED

[Series 1: us abdomen limited · 20 acquisitions, 14 frames shown]
[im 1/20]
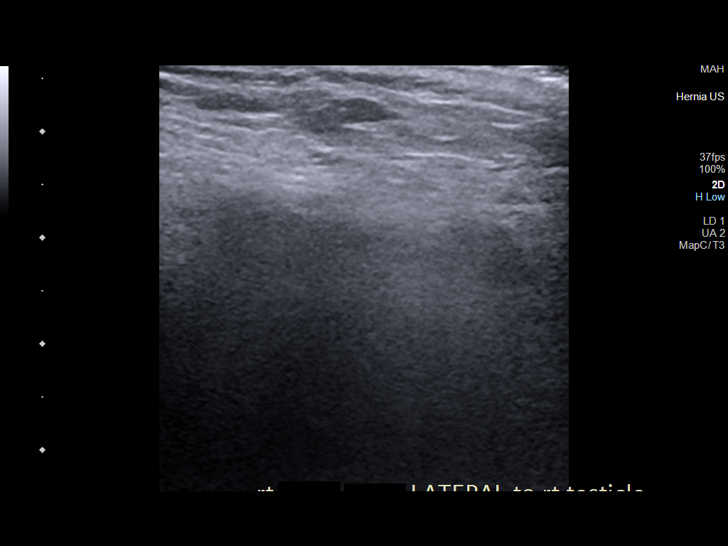
[im 3/20]
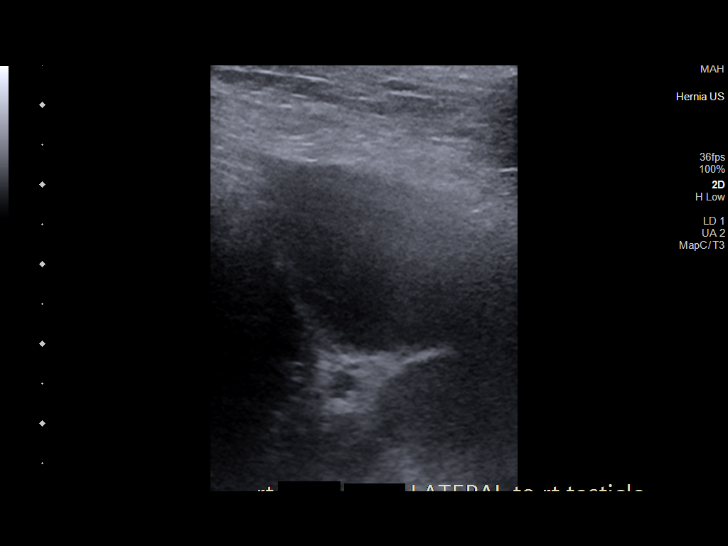
[im 4/20]
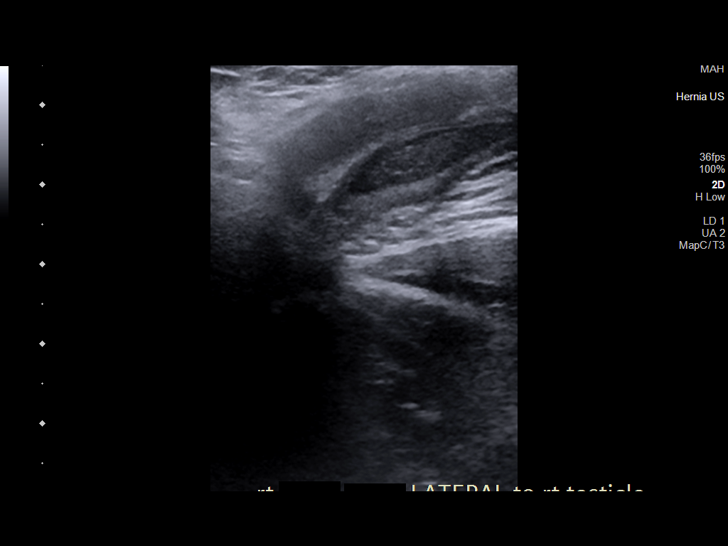
[im 6/20]
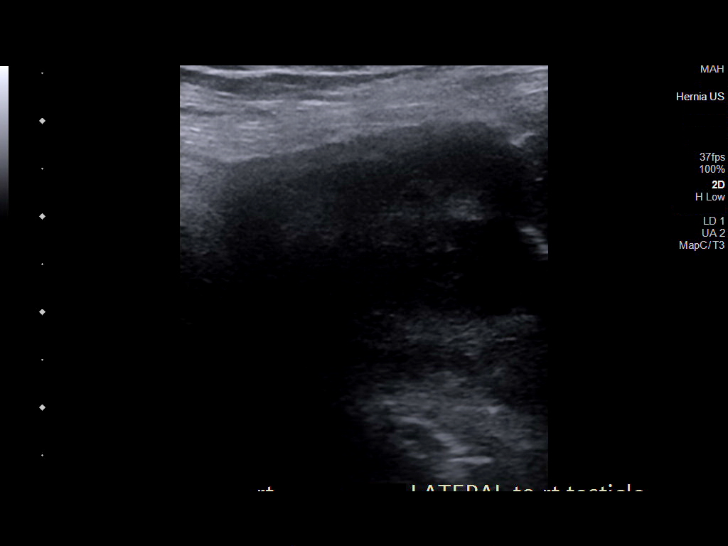
[im 7/20]
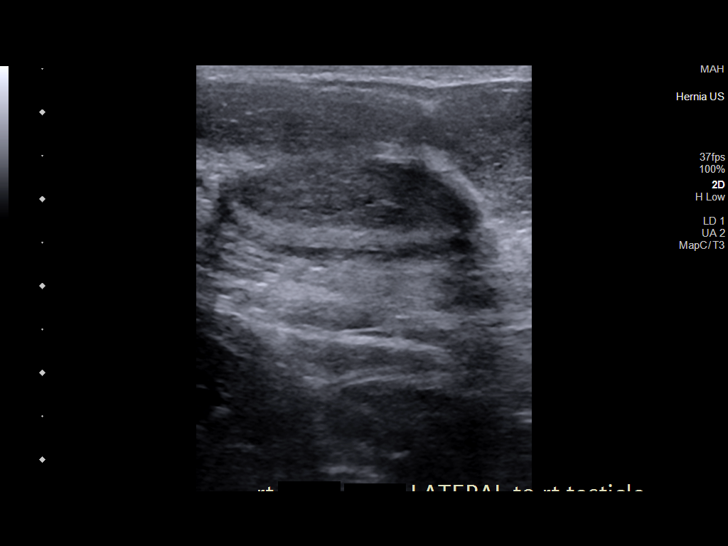
[im 8/20]
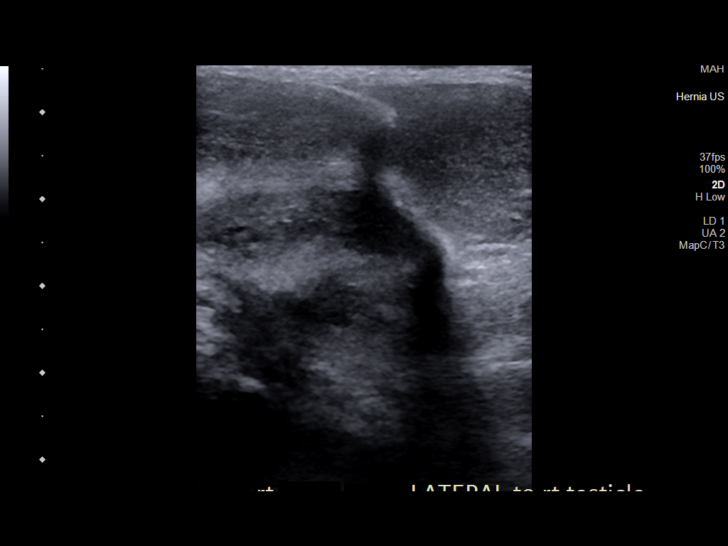
[im 10/20]
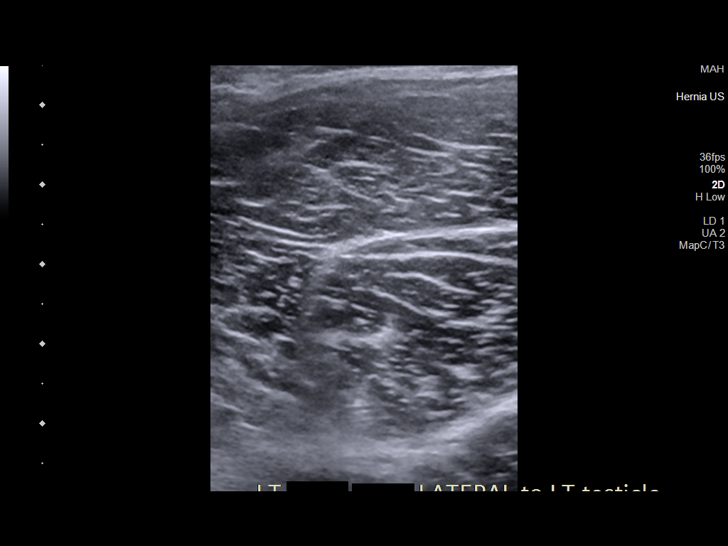
[im 11/20]
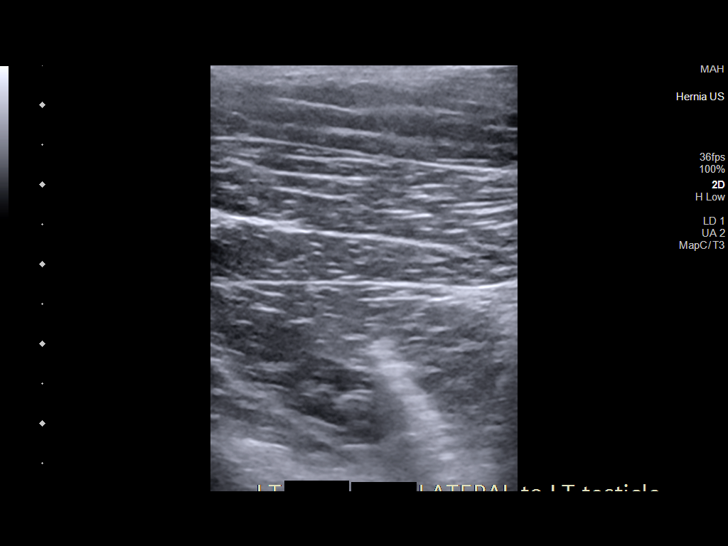
[im 13/20]
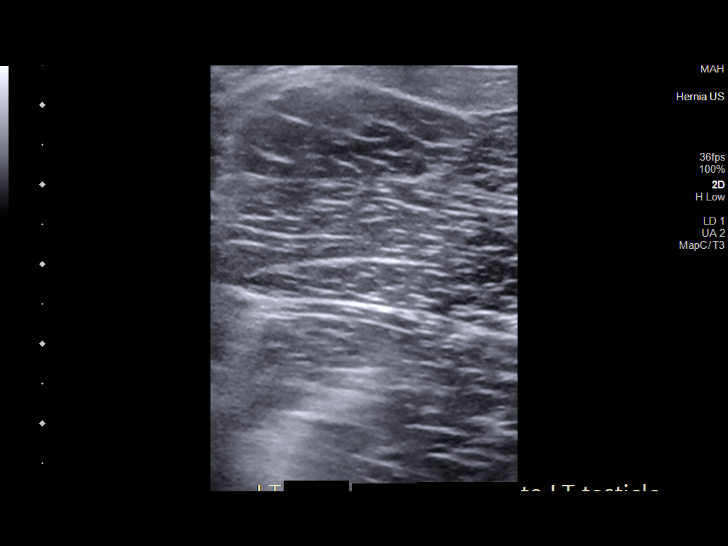
[im 14/20]
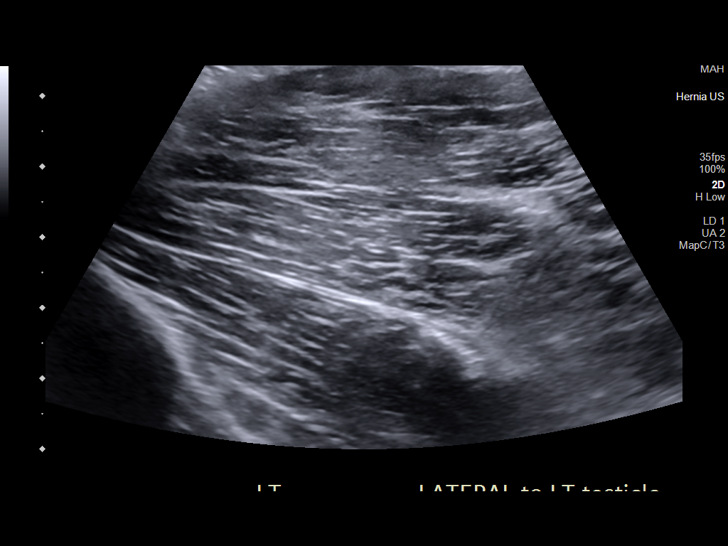
[im 16/20]
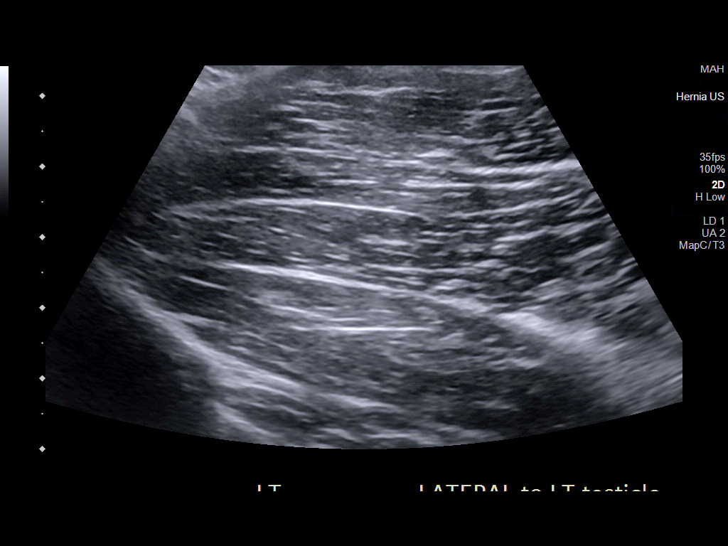
[im 17/20]
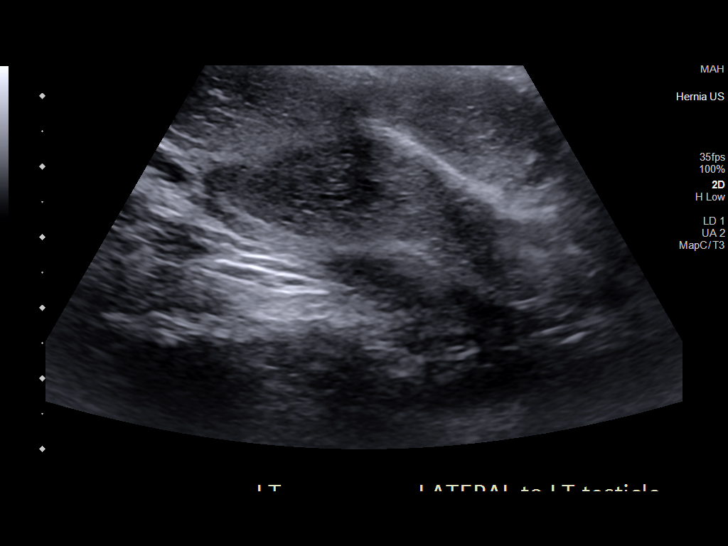
[im 18/20]
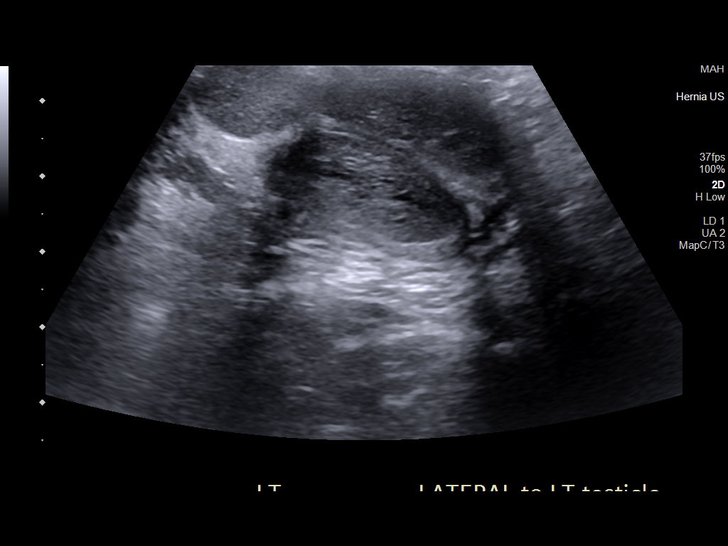
[im 20/20]
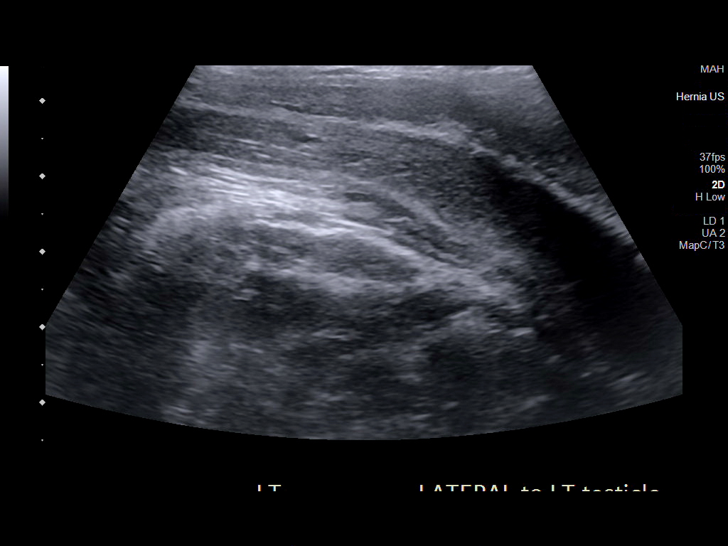

[14 of 20 positions shown; findings below may reference images not displayed]

FINDINGS: Bilateral inguinal regions were imaged. No evidence for cyst, mass
or hernia.
IMPRESSION: Bilateral inguinal regions are within normal limits on ultrasound.

## 2022-11-03 IMAGING — US US ABDOMEN COMPLETE
1 series · 14 of 25 positions shown · non-contrast
Comparison: None.

CLINICAL DATA: Generalized abdominal pain x2 days, with tenderness
around the umbilicus.

EXAM:
ABDOMEN ULTRASOUND COMPLETE

[Series 1: us abdomen complete · 14 of 79 slices shown]
[im 1/79]
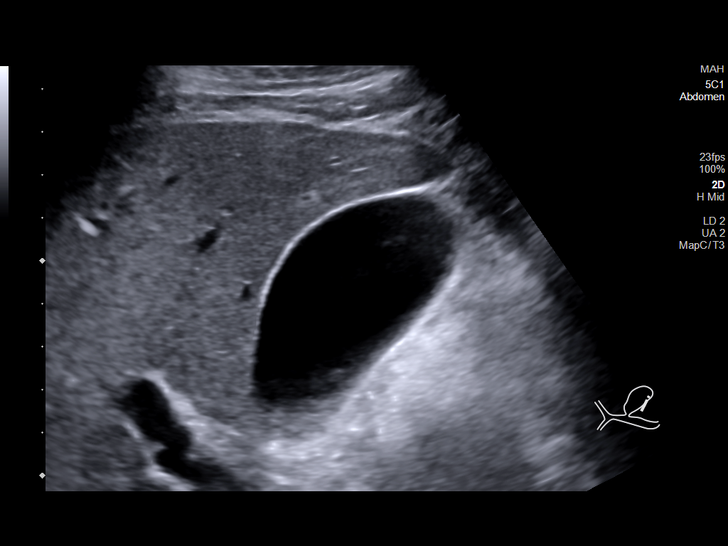
[im 7/79]
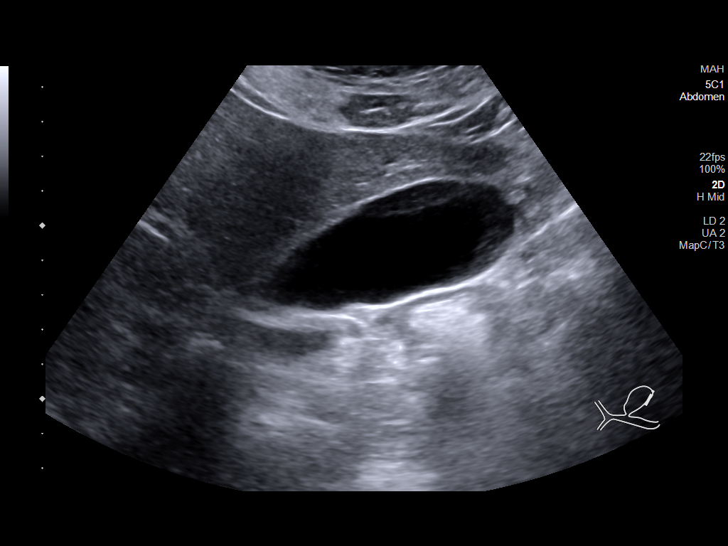
[im 14/79]
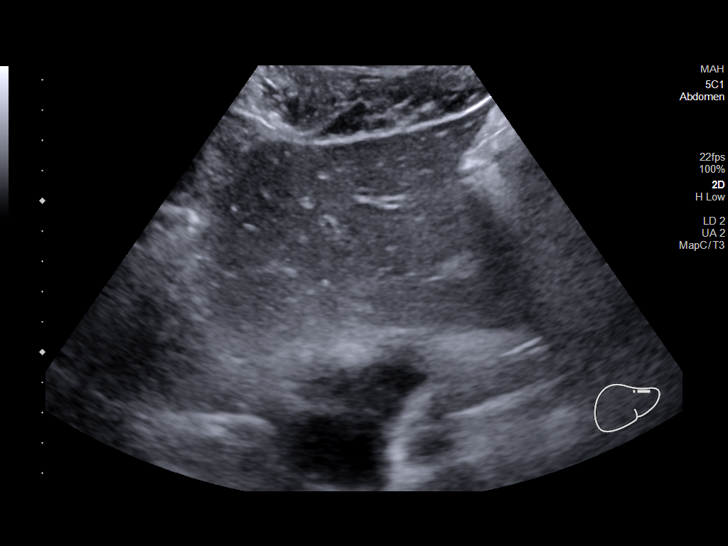
[im 20/79]
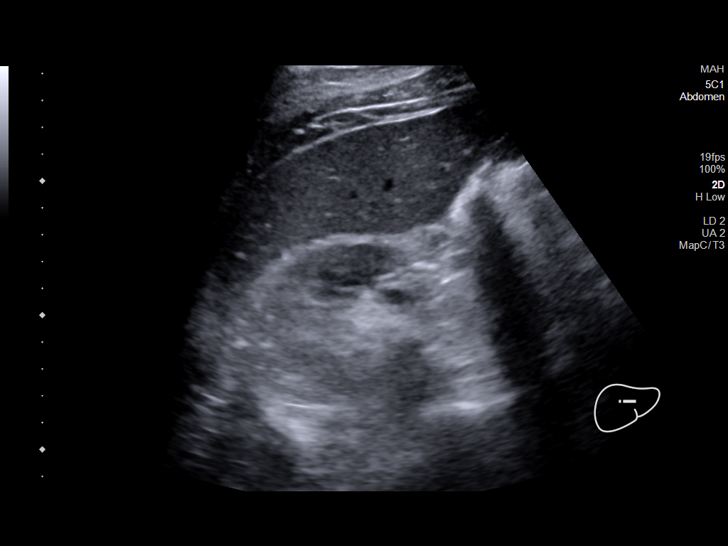
[im 27/79]
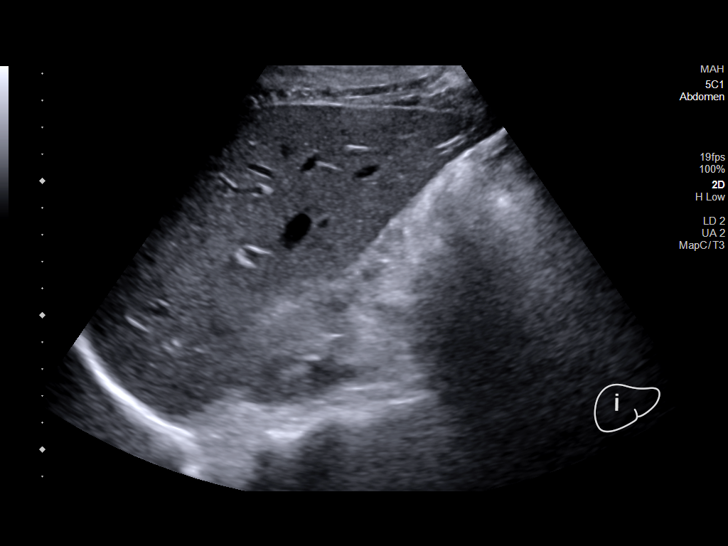
[im 30/79]
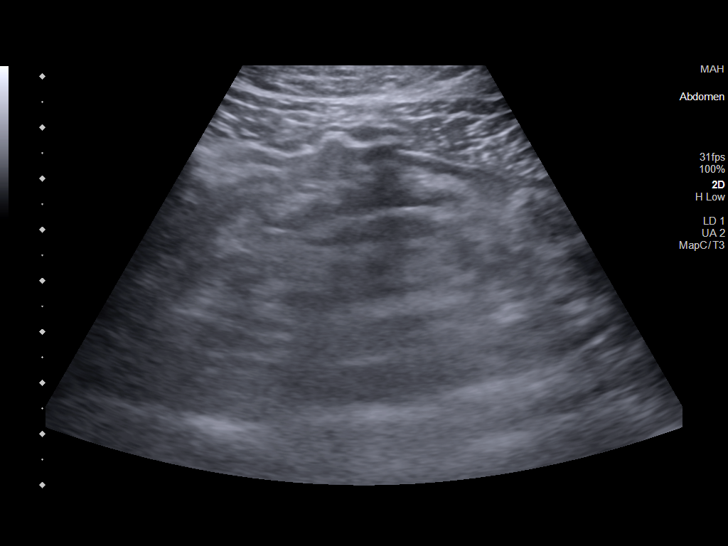
[im 36/79]
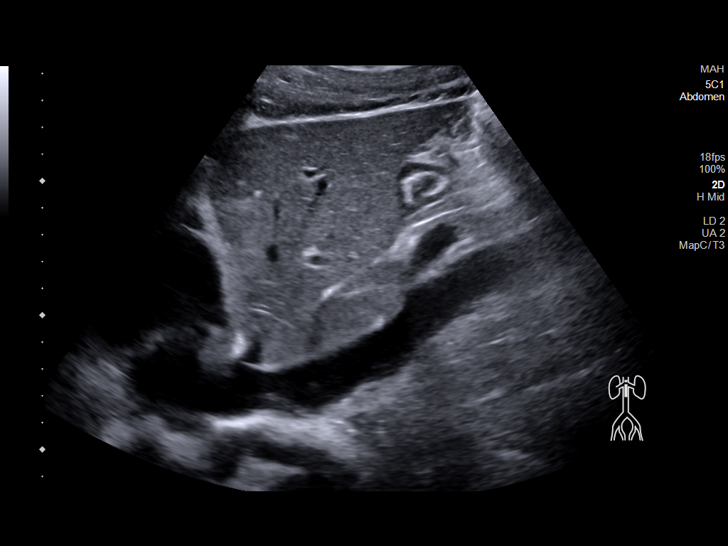
[im 43/79]
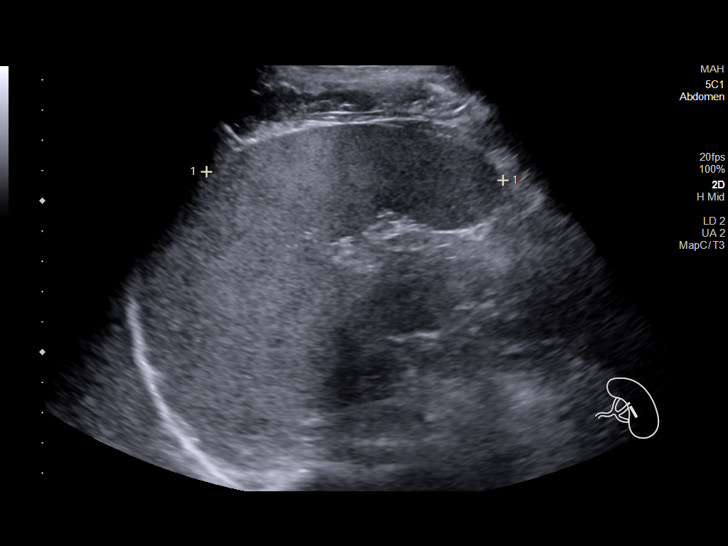
[im 49/79]
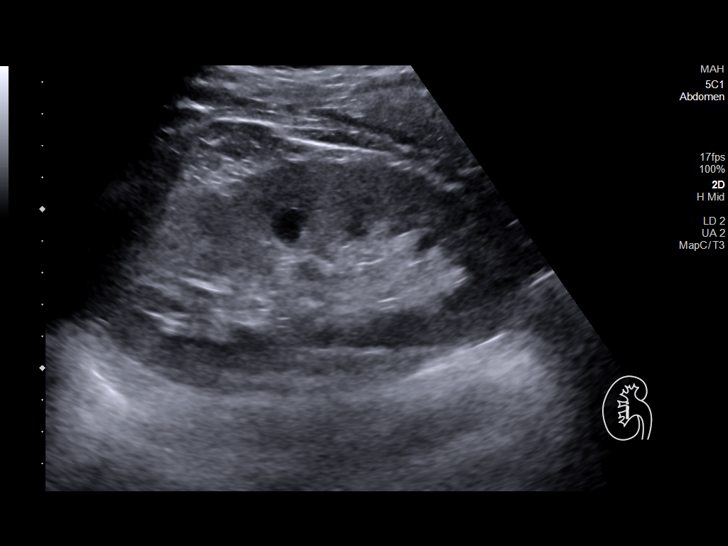
[im 53/79]
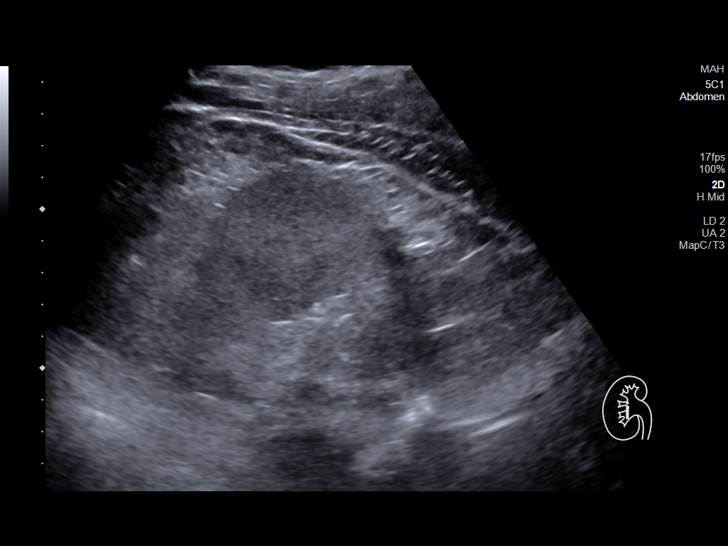
[im 59/79]
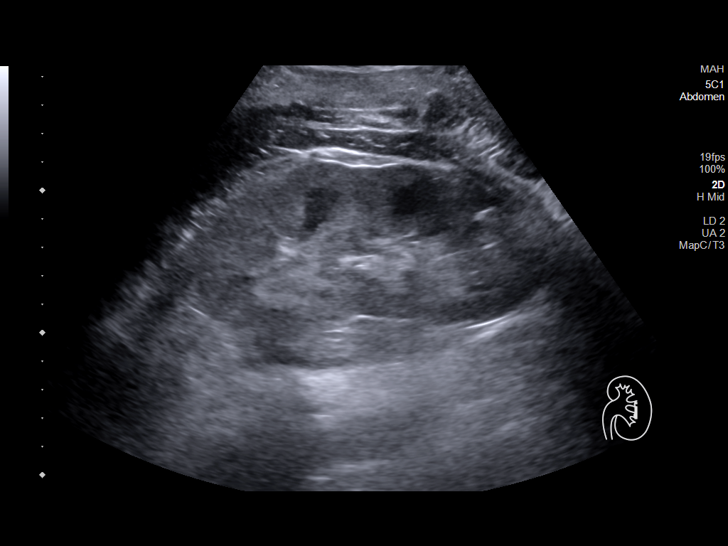
[im 66/79]
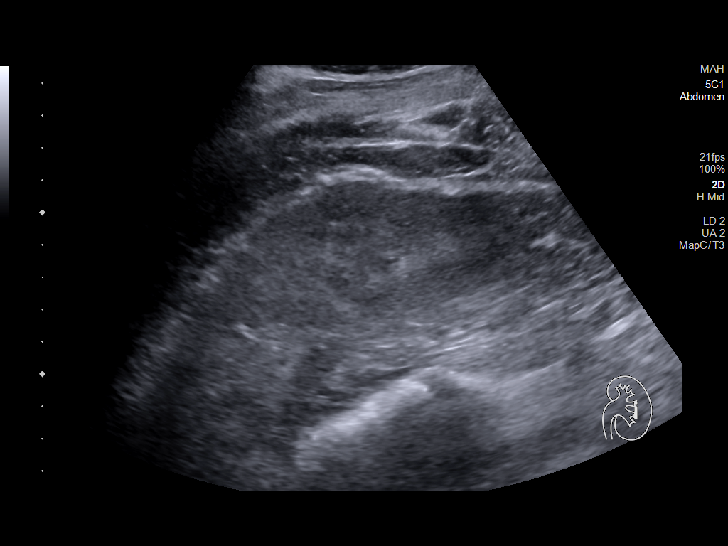
[im 72/79]
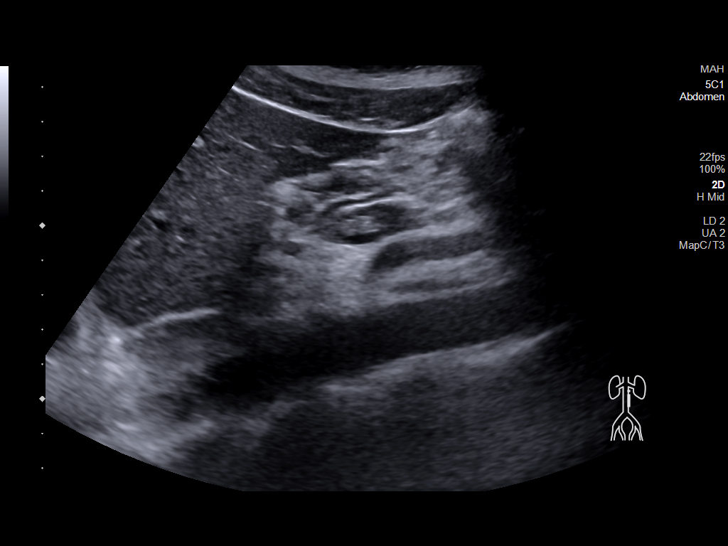
[im 79/79]
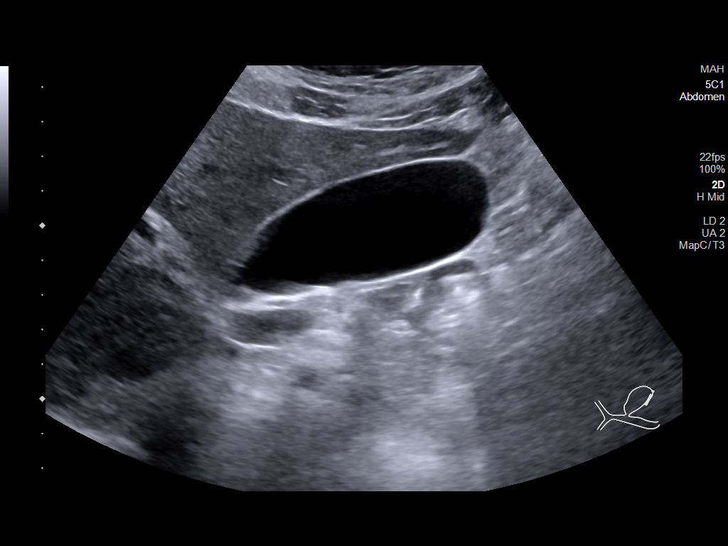

[14 of 25 positions shown; findings below may reference images not displayed]

FINDINGS: Gallbladder: No gallstones or wall thickening visualized (1.2 mm).
No sonographic Murphy sign noted by sonographer.

Common bile duct: Diameter: 3.4 mm

Liver: No focal lesion identified. Within normal limits in
parenchymal echogenicity. Portal vein is patent on color Doppler
imaging with normal direction of blood flow towards the liver.

IVC: No abnormality visualized.

Pancreas: Visualized portion unremarkable.

Spleen: Size (9.8 cm) and appearance within normal limits.

Right Kidney: Length: 13.2 cm. Echogenicity within normal limits. No
mass or hydronephrosis visualized.

Left Kidney: Length: 12.0 cm. Echogenicity within normal limits. No
mass or hydronephrosis visualized.

Abdominal aorta: No aneurysm visualized (1.8 cm in AP diameter).

Other findings: Additional grayscale images of the bilateral
periumbilical areas were obtained and are unremarkable.
IMPRESSION: Normal abdominal ultrasound.

## 2022-11-27 ENCOUNTER — Encounter: Payer: Self-pay | Admitting: Family Medicine

## 2022-11-27 MED ORDER — KETOCONAZOLE 2 % EX SHAM: 1.0000 | MEDICATED_SHAMPOO | CUTANEOUS | 1 refills | Status: DC

## 2022-12-16 ENCOUNTER — Other Ambulatory Visit: Payer: Self-pay | Admitting: Family Medicine

## 2022-12-18 ENCOUNTER — Other Ambulatory Visit: Payer: Self-pay | Admitting: Family Medicine

## 2023-01-02 ENCOUNTER — Other Ambulatory Visit: Payer: Self-pay | Admitting: Family Medicine

## 2023-02-15 ENCOUNTER — Other Ambulatory Visit: Payer: Self-pay | Admitting: Family Medicine

## 2023-02-22 ENCOUNTER — Other Ambulatory Visit: Payer: Self-pay

## 2023-05-29 ENCOUNTER — Ambulatory Visit: Payer: Self-pay | Admitting: Family Medicine

## 2023-05-29 NOTE — Telephone Encounter (Signed)
 Copied from CRM [id number]. Topic: Clinical - Red Word Triage >> May 29, 2023  1:37 PM Antwanette L wrote: Red Word that prompted transfer to Nurse Triage: swollen lymph nodes on the neck(right side)   Chief Complaint: Swollen lymph node  Symptoms: Runny nose, swollen lymph node  Frequency: Constant  Pertinent Negatives: Patient denies pain, fever, other symptoms at this time  Disposition: [] ED /[] Urgent Care (no appt availability in office) / [x] Appointment(In office/virtual)/ []  [facility]/ [] Home Care/ [] Refused Recommended Disposition /[] [facility]/ []  Follow-up with PCP Additional Notes: Patient states that his right sided neck lymph node has been swollen "for a long time" and he would like to make an appointment for it. He states that it is about the size of the tip of his finger. He states he is also experiencing a runny nose. Patient denies any pain, fever, or other symptoms at this time. Appointment made at patient's request.    Reason for Disposition  [1] Mildly swollen lymph node AND [2] has cold symptoms (e.g., runny nose, cough, sore throat)    Appointment scheduled per patient's request  Answer Assessment - Initial Assessment Questions 1. LOCATION: "Where is the swollen node located?" "Is the matching node on the other side of the body also swollen?"      Right side under jaw line  2. SIZE: "How big is the node?" (e.g., inches or centimeters; or compared to common objects such as pea, bean, marble, golf ball)      Tip of finger  3. ONSET: "When did the swelling start?"      "It's been swollen for a long time" 4. NECK NODES: "Is there a sore throat, runny nose or other symptoms of a cold?"      Runny nose 5. GROIN OR ARMPIT NODES: "Is there a sore, scratch, cut or painful red area on that arm or leg?"      No 6. FEVER: "Do you have a fever?" If Yes, ask: "What is it, how was it measured, and when did it start?"      No 7. CAUSE: "What do you think is  causing the swollen lymph nodes?"     Unsure  8. OTHER SYMPTOMS: "Do you have any other symptoms?"     No  Protocols used: Lymph Nodes - Swollen-A-AH

## 2023-05-31 ENCOUNTER — Ambulatory Visit: Payer: 59 | Admitting: Family Medicine

## 2023-06-01 NOTE — Telephone Encounter (Signed)
 Left message for patient to call back to schedule an appointment.

## 2023-06-05 ENCOUNTER — Other Ambulatory Visit: Payer: Self-pay | Admitting: Family Medicine

## 2023-06-06 ENCOUNTER — Ambulatory Visit: Payer: 59 | Admitting: Family Medicine

## 2023-06-07 ENCOUNTER — Ambulatory Visit: Payer: 59 | Admitting: Family Medicine

## 2023-07-10 ENCOUNTER — Ambulatory Visit (INDEPENDENT_AMBULATORY_CARE_PROVIDER_SITE_OTHER): Admitting: Family Medicine

## 2023-07-10 ENCOUNTER — Encounter: Payer: Self-pay | Admitting: Family Medicine

## 2023-07-10 VITALS — BP 112/72 | HR 78 | Ht 72.0 in | Wt 210.0 lb

## 2023-07-10 DIAGNOSIS — R0781 Pleurodynia: Secondary | ICD-10-CM | POA: Insufficient documentation

## 2023-07-10 DIAGNOSIS — R0789 Other chest pain: Secondary | ICD-10-CM | POA: Insufficient documentation

## 2023-07-10 DIAGNOSIS — R221 Localized swelling, mass and lump, neck: Secondary | ICD-10-CM | POA: Diagnosis not present

## 2023-07-10 NOTE — Progress Notes (Signed)
 Jesse Hoover - 33 y.o. male MRN [id number]  Date of birth: [birth year]  Subjective Chief Complaint  Patient presents with   Mass    HPI Jesse Hoover is a 33 y.o. male here today with complaint of swollen lymph node under his R jaw.  He reports that this has been present for at least a few months, maybe longer.  Denies pain associated with this.  Denies sore throat or dental pain. Has not had recent illness.  He denies fever, chills or sweats.  He has had some fatigue and foggy thinking.   ROS:  A comprehensive ROS was completed and negative except as noted per HPI  Allergies  Allergen Reactions   Bee Pollen Swelling    Past Medical History:  Diagnosis Date   Generalized anxiety disorder 06/19/2017    Past Surgical History:  Procedure Laterality Date   NO PAST SURGERIES      Social History   Socioeconomic History   Marital status: Married    Spouse name: Katie   Number of children: 2   Years of education: Not on file   Highest education level: Not on file  Occupational History   Occupation: Tech  Tobacco Use   Smoking status: Never   Smokeless tobacco: Never  Vaping Use   Vaping status: Never Used  Substance and Sexual Activity   Alcohol use: Yes    Alcohol/week: 4.0 - 5.0 standard drinks of alcohol    Types: 4 - 5 Shots of liquor per week   Drug use: Yes    Frequency: 1.0 times per week    Types: Marijuana    Comment: every other weekend   Sexual activity: Yes    Partners: Female    Birth control/protection: None  Other Topics Concern   Not on file  Social History Narrative   Not on file   Social Drivers of Health   Financial Resource Strain: Not on file  Food Insecurity: Low Risk  (05/20/2022)   [facility]   Hunger Vital Sign    Worried About Running Out of Food in the Last Year: Never true    Within the past 12 months, the food you bought just didn't last and you didn't have money to get more: Not on file   Transportation Needs: No Transportation Needs (05/20/2022)   [facility]   Transportation    In the past 12 months, has lack of reliable transportation kept you from medical appointments, meetings, work or from getting things needed for daily living? : No  Physical Activity: Sufficiently Active (06/19/2017)   Exercise Vital Sign    Days of Exercise per Week: 3 days    Minutes of Exercise per Session: 50 min  Stress: Not on file  Social Connections: Unknown (08/16/2021)   [facility]   Social Network    Social Network: Not on file    Family History  Problem Relation Age of Onset   Alcoholism Maternal Grandmother    Leukemia Maternal Grandmother    Esophageal cancer Maternal Grandfather    Lung cancer Maternal Grandfather    Multiple myeloma Mother     Health Maintenance  Topic Date Due   Hepatitis C Screening  Never done   COVID-19 Vaccine (5 - 2024-25 season) 12/10/2022   INFLUENZA VACCINE  11/09/2023   DTaP/Tdap/Td (2 - Td or Tdap) 07/18/2027   HIV Screening  Completed   HPV VACCINES  Aged Out     ----------------------------------------------------------------------------------------------------------------------------------------------------------------------------------------------------------------- Physical Exam BP 112/72 (Cuff Size: Normal)   Pulse 78   Ht 6' (1.829 m)   Wt 210 lb (95.3 kg)   SpO2 98%   BMI 28.48 kg/m   Physical Exam Constitutional:      Appearance: Normal appearance.  HENT:     Head: Normocephalic and atraumatic.  Eyes:     General: No scleral icterus. Neck:     Comments: Small almond size submandibular nodule on the R.  Cardiovascular:     Rate and Rhythm: Normal rate and regular rhythm.  Pulmonary:     Effort: Pulmonary effort is normal.     Breath sounds: Normal breath sounds.  Musculoskeletal:     Cervical back: Neck supple.  Neurological:     Mental Status: He is  alert.     ------------------------------------------------------------------------------------------------------------------------------------------------------------------------------------------------------------------- Assessment and Plan  Nodule of neck Checking labs and will get [place] of area.   Orders Placed This Encounter  Procedures   US Soft Tissue Head/Neck (NON-THYROID)    Standing Status:   Future    Expiration Date:   07/09/2024    Reason for Exam (SYMPTOM  OR DIAGNOSIS REQUIRED):   enlarged nodule under R jaw/upper neck    Preferred imaging location?:   [facility]   CMP14+EGFR   CBC with Differential/Platelet   TSH + free T4   Vitamin D (25 hydroxy)     Rib pain More along the costochondral junction.  Checking Vitamin D levels.    No orders of the defined types were placed in this encounter.   No follow-ups on file.    This visit occurred during the SARS-CoV-2 public health emergency.  Safety protocols were in place, including screening questions prior to the visit, additional usage of staff PPE, and extensive cleaning of exam room while observing appropriate contact time as indicated for disinfecting solutions.

## 2023-07-10 NOTE — Assessment & Plan Note (Signed)
 Checking labs and will get [place] of area.   Orders Placed This Encounter  Procedures   US Soft Tissue Head/Neck (NON-THYROID)    Standing Status:   Future    Expiration Date:   07/09/2024    Reason for Exam (SYMPTOM  OR DIAGNOSIS REQUIRED):   enlarged nodule under R jaw/upper neck    Preferred imaging location?:   [facility]   CMP14+EGFR   CBC with Differential/Platelet   TSH + free T4   Vitamin D (25 hydroxy)

## 2023-07-10 NOTE — Assessment & Plan Note (Signed)
 More along the costochondral junction.  Checking Vitamin D levels.

## 2023-07-11 LAB — CMP14+EGFR
ALT: 31 IU/L (ref 0–44)
AST: 22 IU/L (ref 0–40)
Albumin: 4.7 g/dL (ref 4.1–5.1)
Alkaline Phosphatase: 65 IU/L (ref 44–121)
BUN/Creatinine Ratio: 11 (ref 9–20)
BUN: 12 mg/dL (ref 6–20)
Bilirubin Total: 0.5 mg/dL (ref 0.0–1.2)
CO2: 23 mmol/L (ref 20–29)
Calcium: 9.6 mg/dL (ref 8.7–10.2)
Chloride: 101 mmol/L (ref 96–106)
Creatinine, Ser: 1.07 mg/dL (ref 0.76–1.27)
Globulin, Total: 2.1 g/dL (ref 1.5–4.5)
Glucose: 96 mg/dL (ref 70–99)
Potassium: 4.4 mmol/L (ref 3.5–5.2)
Sodium: 141 mmol/L (ref 134–144)
Total Protein: 6.8 g/dL (ref 6.0–8.5)
eGFR: 94 mL/min/{1.73_m2} (ref >59)

## 2023-07-11 LAB — CBC WITH DIFFERENTIAL/PLATELET
Basophils Absolute: 0 10*3/uL (ref 0.0–0.2)
Basos: 1 %
EOS (ABSOLUTE): 0.3 10*3/uL (ref 0.0–0.4)
Eos: 4 %
Hematocrit: 47.3 % (ref 37.5–51.0)
Hemoglobin: 16 g/dL (ref 13.0–17.7)
Immature Grans (Abs): 0 10*3/uL (ref 0.0–0.1)
Immature Granulocytes: 0 %
Lymphocytes Absolute: 1.6 10*3/uL (ref 0.7–3.1)
Lymphs: 28 %
MCH: 30.9 pg (ref 26.6–33.0)
MCHC: 33.8 g/dL (ref 31.5–35.7)
MCV: 92 fL (ref 79–97)
Monocytes Absolute: 0.3 10*3/uL (ref 0.1–0.9)
Monocytes: 5 %
Neutrophils Absolute: 3.5 10*3/uL (ref 1.4–7.0)
Neutrophils: 62 %
Platelets: 221 10*3/uL (ref 150–450)
RBC: 5.17 x10E6/uL (ref 4.14–5.80)
RDW: 12.1 % (ref 11.6–15.4)
WBC: 5.7 10*3/uL (ref 3.4–10.8)

## 2023-07-11 LAB — VITAMIN D 25 HYDROXY (VIT D DEFICIENCY, FRACTURES): Vit D, 25-Hydroxy: 33.6 ng/mL (ref 30.0–100.0)

## 2023-07-11 LAB — TSH+FREE T4
Free T4: 1.27 ng/dL (ref 0.82–1.77)
TSH: 1.65 u[IU]/mL (ref 0.450–4.500)

## 2023-07-13 ENCOUNTER — Ambulatory Visit

## 2023-07-13 DIAGNOSIS — R221 Localized swelling, mass and lump, neck: Secondary | ICD-10-CM

## 2023-07-15 ENCOUNTER — Encounter: Payer: Self-pay | Admitting: Family Medicine

## 2023-07-20 ENCOUNTER — Encounter: Payer: Self-pay | Admitting: Family Medicine

## 2023-07-20 DIAGNOSIS — R221 Localized swelling, mass and lump, neck: Secondary | ICD-10-CM

## 2023-08-15 NOTE — Telephone Encounter (Signed)
I called and left a message for patient to call back to schedule an appointment.

## 2023-09-04 ENCOUNTER — Other Ambulatory Visit: Payer: Self-pay | Admitting: Family Medicine

## 2023-09-07 MED ORDER — AMOXICILLIN-POT CLAVULANATE 875-125 MG PO TABS
1.0000 | ORAL_TABLET | Freq: Two times a day (BID) | ORAL | 0 refills | Status: AC
Start: 2023-09-07 — End: ?

## 2023-09-07 NOTE — Addendum Note (Signed)
 Addended by: Brylinn Teaney E on: 09/07/2023 12:30 PM   Modules accepted: Orders

## 2023-09-29 ENCOUNTER — Other Ambulatory Visit: Payer: Self-pay | Admitting: Family Medicine

## 2023-10-15 NOTE — Telephone Encounter (Signed)
 Attempted call to patient. Left a voice mail message requesting a return call.

## 2023-10-18 NOTE — Telephone Encounter (Signed)
 Attempted call to patient. Left a voice mail message requesting a return call.

## 2023-10-19 NOTE — Addendum Note (Signed)
 Addended by: BONNY JON DEL on: 10/19/2023 07:12 AM   Modules accepted: Orders

## 2023-10-24 NOTE — Addendum Note (Signed)
 Addended by: Xsavier Seeley E on: 10/24/2023 10:58 AM   Modules accepted: Orders

## 2023-12-25 ENCOUNTER — Ambulatory Visit: Admitting: Family Medicine

## 2024-01-09 ENCOUNTER — Other Ambulatory Visit: Payer: Self-pay | Admitting: Family Medicine

## 2024-03-02 ENCOUNTER — Encounter: Payer: Self-pay | Admitting: Family Medicine

## 2024-03-17 ENCOUNTER — Other Ambulatory Visit: Payer: Self-pay | Admitting: Family Medicine
